# Patient Record
Sex: Male | Born: 1960
Health system: Southern US, Community
[De-identification: ages and names within clinical notes are randomized; demographics above are authoritative.]

## PROBLEM LIST (undated history)

## (undated) DIAGNOSIS — I2510 Atherosclerotic heart disease of native coronary artery without angina pectoris: Secondary | ICD-10-CM

## (undated) HISTORY — PX: CARDIAC SURGERY: SHX584

## (undated) HISTORY — PX: APPENDECTOMY: SHX54

---

## 2020-02-13 ENCOUNTER — Inpatient Hospital Stay (HOSPITAL_BASED_OUTPATIENT_CLINIC_OR_DEPARTMENT_OTHER)
Admission: EM | Admit: 2020-02-13 | Discharge: 2020-02-15 | DRG: 300 | Disposition: A | Discharge status: Home / Self Care | Payer: 59 | Attending: Internal Medicine | Admitting: Internal Medicine

## 2020-02-13 ENCOUNTER — Other Ambulatory Visit: Payer: Self-pay

## 2020-02-13 ENCOUNTER — Encounter (HOSPITAL_BASED_OUTPATIENT_CLINIC_OR_DEPARTMENT_OTHER): Payer: Self-pay | Admitting: Emergency Medicine

## 2020-02-13 ENCOUNTER — Emergency Department (HOSPITAL_BASED_OUTPATIENT_CLINIC_OR_DEPARTMENT_OTHER): Payer: 59

## 2020-02-13 ENCOUNTER — Other Ambulatory Visit: Payer: Self-pay | Admitting: Oncology

## 2020-02-13 DIAGNOSIS — Z951 Presence of aortocoronary bypass graft: Secondary | ICD-10-CM | POA: Diagnosis not present

## 2020-02-13 DIAGNOSIS — D735 Infarction of spleen: Secondary | ICD-10-CM | POA: Diagnosis present

## 2020-02-13 DIAGNOSIS — I82890 Acute embolism and thrombosis of other specified veins: Secondary | ICD-10-CM | POA: Diagnosis present

## 2020-02-13 DIAGNOSIS — E785 Hyperlipidemia, unspecified: Secondary | ICD-10-CM | POA: Diagnosis present

## 2020-02-13 DIAGNOSIS — E669 Obesity, unspecified: Secondary | ICD-10-CM | POA: Diagnosis present

## 2020-02-13 DIAGNOSIS — I252 Old myocardial infarction: Secondary | ICD-10-CM | POA: Diagnosis not present

## 2020-02-13 DIAGNOSIS — N179 Acute kidney failure, unspecified: Secondary | ICD-10-CM | POA: Diagnosis present

## 2020-02-13 DIAGNOSIS — I5031 Acute diastolic (congestive) heart failure: Secondary | ICD-10-CM | POA: Diagnosis not present

## 2020-02-13 DIAGNOSIS — Z20822 Contact with and (suspected) exposure to covid-19: Secondary | ICD-10-CM | POA: Diagnosis present

## 2020-02-13 DIAGNOSIS — I10 Essential (primary) hypertension: Secondary | ICD-10-CM | POA: Diagnosis present

## 2020-02-13 DIAGNOSIS — I2510 Atherosclerotic heart disease of native coronary artery without angina pectoris: Secondary | ICD-10-CM | POA: Diagnosis present

## 2020-02-13 DIAGNOSIS — Z882 Allergy status to sulfonamides status: Secondary | ICD-10-CM | POA: Diagnosis not present

## 2020-02-13 DIAGNOSIS — R109 Unspecified abdominal pain: Secondary | ICD-10-CM | POA: Diagnosis present

## 2020-02-13 DIAGNOSIS — R1013 Epigastric pain: Secondary | ICD-10-CM

## 2020-02-13 DIAGNOSIS — R9431 Abnormal electrocardiogram [ECG] [EKG]: Secondary | ICD-10-CM | POA: Diagnosis not present

## 2020-02-13 DIAGNOSIS — Z8249 Family history of ischemic heart disease and other diseases of the circulatory system: Secondary | ICD-10-CM

## 2020-02-13 DIAGNOSIS — I748 Embolism and thrombosis of other arteries: Secondary | ICD-10-CM | POA: Diagnosis not present

## 2020-02-13 DIAGNOSIS — Z6836 Body mass index (BMI) 36.0-36.9, adult: Secondary | ICD-10-CM | POA: Diagnosis not present

## 2020-02-13 HISTORY — DX: Atherosclerotic heart disease of native coronary artery without angina pectoris: I25.10

## 2020-02-13 LAB — PROTIME-INR
INR: 1.3 — ABNORMAL HIGH (ref 0.8–1.2)
Prothrombin Time: 15.4 seconds — ABNORMAL HIGH (ref 11.4–15.2)

## 2020-02-13 LAB — CBC WITH DIFFERENTIAL/PLATELET
Abs Immature Granulocytes: 0.05 10*3/uL (ref 0.00–0.07)
Basophils Absolute: 0.1 10*3/uL (ref 0.0–0.1)
Basophils Relative: 1 %
Eosinophils Absolute: 0.1 10*3/uL (ref 0.0–0.5)
Eosinophils Relative: 1 %
HCT: 45.5 % (ref 39.0–52.0)
Hemoglobin: 15.1 g/dL (ref 13.0–17.0)
Immature Granulocytes: 0 %
Lymphocytes Relative: 12 %
Lymphs Abs: 1.8 10*3/uL (ref 0.7–4.0)
MCH: 29.3 pg (ref 26.0–34.0)
MCHC: 33.2 g/dL (ref 30.0–36.0)
MCV: 88.3 fL (ref 80.0–100.0)
Monocytes Absolute: 1 10*3/uL (ref 0.1–1.0)
Monocytes Relative: 6 %
Neutro Abs: 12.4 10*3/uL — ABNORMAL HIGH (ref 1.7–7.7)
Neutrophils Relative %: 80 %
Platelets: 191 10*3/uL (ref 150–400)
RBC: 5.15 MIL/uL (ref 4.22–5.81)
RDW: 11.7 % (ref 11.5–15.5)
WBC: 15.4 10*3/uL — ABNORMAL HIGH (ref 4.0–10.5)
nRBC: 0 % (ref 0.0–0.2)

## 2020-02-13 LAB — COMPREHENSIVE METABOLIC PANEL
ALT: 47 U/L — ABNORMAL HIGH (ref 0–44)
AST: 38 U/L (ref 15–41)
Albumin: 4 g/dL (ref 3.5–5.0)
Alkaline Phosphatase: 86 U/L (ref 38–126)
Anion gap: 9 (ref 5–15)
BUN: 12 mg/dL (ref 6–20)
CO2: 24 mmol/L (ref 22–32)
Calcium: 8.9 mg/dL (ref 8.9–10.3)
Chloride: 106 mmol/L (ref 98–111)
Creatinine, Ser: 1.34 mg/dL — ABNORMAL HIGH (ref 0.61–1.24)
GFR calc Af Amer: >60 mL/min (ref >60)
GFR calc non Af Amer: 57 mL/min — ABNORMAL LOW (ref >60)
Glucose, Bld: 124 mg/dL — ABNORMAL HIGH (ref 70–99)
Potassium: 4.1 mmol/L (ref 3.5–5.1)
Sodium: 139 mmol/L (ref 135–145)
Total Bilirubin: 0.7 mg/dL (ref 0.3–1.2)
Total Protein: 7.2 g/dL (ref 6.5–8.1)

## 2020-02-13 LAB — TROPONIN I (HIGH SENSITIVITY)
Troponin I (High Sensitivity): 9 ng/L (ref <18)
Troponin I (High Sensitivity): 8 ng/L (ref <18)

## 2020-02-13 LAB — HEMOGLOBIN A1C
Hgb A1c MFr Bld: 5.4 % (ref 4.8–5.6)
Mean Plasma Glucose: 108.28 mg/dL

## 2020-02-13 LAB — HEPARIN LEVEL (UNFRACTIONATED)
Heparin Unfractionated: 1.08 IU/mL — ABNORMAL HIGH (ref 0.30–0.70)
Heparin Unfractionated: 1.01 IU/mL — ABNORMAL HIGH (ref 0.30–0.70)

## 2020-02-13 LAB — HIV ANTIBODY (ROUTINE TESTING W REFLEX): HIV Screen 4th Generation wRfx: NONREACTIVE

## 2020-02-13 LAB — LIPASE, BLOOD: Lipase: 72 U/L — ABNORMAL HIGH (ref 11–51)

## 2020-02-13 LAB — SARS CORONAVIRUS 2 BY RT PCR (HOSPITAL ORDER, PERFORMED IN ~~LOC~~ HOSPITAL LAB): SARS Coronavirus 2: NEGATIVE

## 2020-02-13 LAB — APTT: aPTT: >200 seconds (ref 24–36)

## 2020-02-13 MED ORDER — ATORVASTATIN CALCIUM 40 MG PO TABS
40.0000 mg | ORAL_TABLET | Freq: Every day | ORAL | Status: DC
  Administered 2020-02-13 – 2020-02-15 (×3): 40 mg via ORAL
  Filled 2020-02-13 (×3): qty 1

## 2020-02-13 MED ORDER — NITROGLYCERIN 0.4 MG SL SUBL
0.4000 mg | SUBLINGUAL_TABLET | SUBLINGUAL | Status: AC | PRN
  Administered 2020-02-13 (×3): 0.4 mg via SUBLINGUAL
  Filled 2020-02-13 (×3): qty 1

## 2020-02-13 MED ORDER — ACETAMINOPHEN 325 MG PO TABS: 650.0000 mg | ORAL_TABLET | Freq: Four times a day (QID) | ORAL | Status: DC | PRN

## 2020-02-13 MED ORDER — ASPIRIN 81 MG PO CHEW
81.0000 mg | CHEWABLE_TABLET | Freq: Every day | ORAL | Status: DC
  Administered 2020-02-14 – 2020-02-15 (×2): 81 mg via ORAL
  Filled 2020-02-13 (×2): qty 1

## 2020-02-13 MED ORDER — HEPARIN BOLUS VIA INFUSION
5000.0000 [IU] | Freq: Once | INTRAVENOUS | Status: AC
  Administered 2020-02-13: 5000 [IU] via INTRAVENOUS

## 2020-02-13 MED ORDER — LISINOPRIL 5 MG PO TABS
5.0000 mg | ORAL_TABLET | Freq: Every day | ORAL | Status: DC
  Administered 2020-02-13: 5 mg via ORAL
  Filled 2020-02-13: qty 1

## 2020-02-13 MED ORDER — METOPROLOL SUCCINATE ER 100 MG PO TB24
100.0000 mg | ORAL_TABLET | Freq: Every day | ORAL | Status: DC
  Administered 2020-02-14 – 2020-02-15 (×2): 100 mg via ORAL
  Filled 2020-02-13 (×2): qty 1

## 2020-02-13 MED ORDER — MORPHINE SULFATE (PF) 2 MG/ML IV SOLN
2.0000 mg | Freq: Once | INTRAVENOUS | Status: AC
  Administered 2020-02-13: 2 mg via INTRAVENOUS
  Filled 2020-02-13: qty 1

## 2020-02-13 MED ORDER — LACTATED RINGERS IV SOLN: INTRAVENOUS | Status: DC

## 2020-02-13 MED ORDER — MORPHINE SULFATE (PF) 4 MG/ML IV SOLN
4.0000 mg | INTRAVENOUS | Status: DC | PRN
  Administered 2020-02-13 – 2020-02-14 (×4): 4 mg via INTRAVENOUS
  Filled 2020-02-13 (×4): qty 1

## 2020-02-13 MED ORDER — HEPARIN (PORCINE) 25000 UT/250ML-% IV SOLN
1500.0000 [IU]/h | INTRAVENOUS | Status: DC
  Administered 2020-02-13: 1500 [IU]/h via INTRAVENOUS
  Filled 2020-02-13 (×2): qty 250

## 2020-02-13 MED ORDER — ONDANSETRON HCL 4 MG/2ML IJ SOLN
4.0000 mg | Freq: Once | INTRAMUSCULAR | Status: AC
  Administered 2020-02-13: 4 mg via INTRAVENOUS
  Filled 2020-02-13: qty 2

## 2020-02-13 MED ORDER — ONDANSETRON HCL 4 MG/2ML IJ SOLN
4.0000 mg | Freq: Four times a day (QID) | INTRAMUSCULAR | Status: DC | PRN
  Administered 2020-02-13: 4 mg via INTRAVENOUS
  Filled 2020-02-13: qty 2

## 2020-02-13 MED ORDER — IOHEXOL 300 MG/ML  SOLN
100.0000 mL | Freq: Once | INTRAMUSCULAR | Status: AC | PRN
  Administered 2020-02-13: 100 mL via INTRAVENOUS

## 2020-02-13 MED ORDER — ACETAMINOPHEN 650 MG RE SUPP: 650.0000 mg | Freq: Four times a day (QID) | RECTAL | Status: DC | PRN

## 2020-02-13 MED ORDER — HYDROCODONE-ACETAMINOPHEN 5-325 MG PO TABS
1.0000 | ORAL_TABLET | Freq: Four times a day (QID) | ORAL | Status: DC | PRN
  Administered 2020-02-14: 1 via ORAL
  Filled 2020-02-13: qty 1

## 2020-02-13 MED ORDER — HEPARIN (PORCINE) 25000 UT/250ML-% IV SOLN
1300.0000 [IU]/h | INTRAVENOUS | Status: DC
  Administered 2020-02-13 – 2020-02-14 (×3): 1300 [IU]/h via INTRAVENOUS
  Filled 2020-02-13 (×3): qty 250

## 2020-02-13 MED ORDER — NITROGLYCERIN 0.4 MG SL SUBL: 0.4000 mg | SUBLINGUAL_TABLET | SUBLINGUAL | Status: DC | PRN

## 2020-02-13 MED ORDER — HYDRALAZINE HCL 20 MG/ML IJ SOLN: 10.0000 mg | Freq: Four times a day (QID) | INTRAMUSCULAR | Status: DC | PRN

## 2020-02-13 MED ORDER — POLYETHYLENE GLYCOL 3350 17 G PO PACK: 17.0000 g | PACK | Freq: Every day | ORAL | Status: DC | PRN

## 2020-02-13 MED ORDER — ASPIRIN 81 MG PO CHEW
324.0000 mg | CHEWABLE_TABLET | Freq: Once | ORAL | Status: AC
  Administered 2020-02-13: 324 mg via ORAL
  Filled 2020-02-13: qty 4

## 2020-02-13 NOTE — ED Provider Notes (Addendum)
TIME SEEN: 5:14 AM  CHIEF COMPLAINT: Upper abdominal pain  HPI: Patient is a 59 year old male with history of CAD status post two-vessel CABG in 2017 at [facility] who presents to the emergency department with complaints of upper abdominal soreness that started at 11 PM last night.  Reports around 1 AM he took 2 Tylenol hoping it would improve.  Came in for further evaluation.  States this does not feel exactly like his anginal equivalent because at that time he had chest pressure and he is having no chest pain currently.  Denies shortness of breath, nausea, vomiting, diarrhea, fever, bloody stools, melena.  He states he was most concerned that this could be his heart.  His cardiologist is Dr. Judithe Modest in [facility].  States he still has his gallbladder.  States for dinner last night he ate chicken pie, corn, beans.  No history of PE, DVT, exogenous estrogen use, recent fractures, surgery, trauma, hospitalization or prolonged travel. No lower extremity swelling or pain. No calf tenderness.  ROS: See HPI Constitutional: no fever  Eyes: no drainage  ENT: no runny nose   Cardiovascular:  no chest pain  Resp: no SOB  GI: no vomiting GU: no dysuria Integumentary: no rash  Allergy: no hives  Musculoskeletal: no leg swelling  Neurological: no slurred speech ROS otherwise negative  PAST MEDICAL HISTORY/PAST SURGICAL HISTORY:  Past Medical History:  Diagnosis Date  . Coronary artery disease     MEDICATIONS:  Prior to Admission medications   Medication Sig Start Date End Date Taking? Authorizing Provider  aspirin 81 MG chewable tablet Chew by mouth. 07/12/16  Yes [provider]  atorvastatin (LIPITOR) 40 MG tablet TAKE 1 TABLET BY MOUTH EVERY DAY FOR CHOLESTEROL 10/17/16  Yes [provider]  lisinopril (ZESTRIL) 5 MG tablet TAKE 1 TABLET BY MOUTH EVERY DAY FOR BLOOD PRESSURE 10/17/16  Yes [provider]  metoprolol succinate (TOPROL-XL) 100 MG 24  hr tablet Take 1 tablet by mouth daily. 10/17/16  Yes [provider]  nitroGLYCERIN (NITROSTAT) 0.4 MG SL tablet Place under the tongue. 10/07/19  Yes [provider]    ALLERGIES:  Allergies  Allergen Reactions  . Sulfa Antibiotics Rash    SOCIAL HISTORY:  Social History   Tobacco Use  . Smoking status: Never Smoker  . Smokeless tobacco: Never Used  Substance Use Topics  . Alcohol use: Never    FAMILY HISTORY: No family history on file.  EXAM: BP (!) 170/90 (BP Location: Right Arm)   Pulse 60   Temp 98 F (36.7 C) (Oral)   Resp 20   SpO2 96%  CONSTITUTIONAL: Alert and oriented and responds appropriately to questions.  Obese, no significant distress HEAD: Normocephalic EYES: Conjunctivae clear, pupils appear equal, EOM appear intact ENT: normal nose; moist mucous membranes NECK: Supple, normal ROM CARD: RRR; S1 and S2 appreciated; no murmurs, no clicks, no rubs, no gallops RESP: Normal chest excursion without splinting or tachypnea; breath sounds clear and equal bilaterally; no wheezes, no rhonchi, no rales, no hypoxia or respiratory distress, speaking full sentences ABD/GI: Normal bowel sounds; non-distended; soft, minimally tender to deep palpation in the right upper quadrant but negative Murphy sign, no rebound, no guarding, no peritoneal signs, no hepatosplenomegaly BACK:  The back appears normal EXT: Normal ROM in all joints; no deformity noted, no edema; no cyanosis, no calf tenderness or calf swelling SKIN: Normal color for age and race; warm; no rash on exposed skin NEURO: Moves  all extremities equally PSYCH: The patient's mood and manner are appropriate.   MEDICAL DECISION MAKING: Patient here with upper abdominal pain.  He states this feels different than his anginal equivalent but he was not concerned that this could be cardiac in nature.  His EKG here shows minimal ST elevation in inferior leads and PR depression and there is also ST depression  in lateral leads.  There is no old here for comparison.  We were able to get a copy of his old EKG from October 2017 from [facility] that shows that these abnormalities appear to be new.  Will discuss with his cardiologist at [facility].  He does not want to go to [facility] as he states he knows the [employer].  Will obtain cardiac labs, give aspirin and nitroglycerin.  Will also obtain abdominal labs and CT of abdomen pelvis as this also could be cholelithiasis, cholangitis, cholecystitis, pancreatitis, gastritis, GERD.  ED PROGRESS: Patient's pain has come down from an 8/10 down to a 4/3:10 nitroglycerin tablets.  We will give morphine, Zofran.   6:00 AM  Spoke with Dr. Judithe Modest with cardiology at [facility].  Discussed these EKG changes with his cardiologist.  He agrees that patient should be monitored overnight but at this time there are no beds at [facility].  Patient's EKG does not meet STEMI criteria and his initial troponin after having pain for several hours is 9.  Cardiology recommends discussion with hospitalist for admission.   7:20 AM  Pt's CT of the abdomen pelvis reviewed/interpreted and shows a large splenic infarct associated with a probable splenic artery branch infarct.  Will start heparin.  Updated patient and wife at bedside.  Suspect this is the cause of his upper abdominal pain.  We did discuss that he needs admission to the hospital for IV heparin and possible vascular surgery, general surgery consultations.  We also have recommended per his cardiologist recommendations that he have serial troponins given his EKG changes.  Unfortunately at this time there are no beds available at [facility] or [facility] hospital and they were unable to accept patient currently.  Discussed this with patient and wife.  Patient is upset as he states he would prefer to go to [facility].  He would like time to talk to his wife before  deciding what to do next.    7:35 AM  Spoke with patient and wife again.  He agrees at this time to go to [facility] for admission.  IV heparin has been ordered.  Reports his pain is coming back.  Will give another dose of morphine here.  I reviewed all nursing notes and pertinent previous records as available.  I have reviewed and interpreted any EKGs, lab and urine results, imaging (as available).   EKG Interpretation  Date/Time:  Friday February 13 2020 05:08:53 EDT Ventricular Rate:  58 PR Interval:    QRS Duration: 89 QT Interval:  399 QTC Calculation: 392 R Axis:   71 Text Interpretation: Sinus rhythm Probable left atrial enlargement Abnormal R-wave progression, late transition minimal ST elevation and PR depression in inferior leads ST depression in lateral leads No old tracing to compare Confirmed by Naveya Ellerman, Baxter Hire [phone]) on 02/13/2020 6:40:34 AM        CRITICAL CARE Performed by: Baxter Hire Franziska Podgurski   Total critical care time: 65 minutes  Critical care time was exclusive of separately billable procedures and treating other patients.  Critical  care was necessary to treat or prevent imminent or life-threatening deterioration.  Critical care was time spent personally by me on the following activities: development of treatment plan with patient and/or surrogate as well as nursing, discussions with consultants, evaluation of patient's response to treatment, examination of patient, obtaining history from patient or surrogate, ordering and performing treatments and interventions, ordering and review of laboratory studies, ordering and review of radiographic studies, pulse oximetry and re-evaluation of patient's condition.   Javier White was evaluated in Emergency Department on 02/13/2020 for the symptoms described in the history of present illness. He was evaluated in the context of the global COVID-19 pandemic, which necessitated consideration that the patient might be at risk  for infection with the SARS-CoV-2 virus that causes COVID-19. Institutional protocols and algorithms that pertain to the evaluation of patients at risk for COVID-19 are in a state of rapid change based on information released by regulatory bodies including the CDC and federal and state organizations. These policies and algorithms were followed during the patient's care in the ED.         Dae Antonucci, Delice Bison, DO 02/13/20 [phone]

## 2020-02-13 NOTE — ED Notes (Signed)
PT at CT

## 2020-02-13 NOTE — Progress Notes (Addendum)
Received a call from Dr. Thedore Mins [facility] requesting outpatient appointment with Dr. Bertis Ruddy for hypercoagulability work-up.  New patient hematology referral placed.

## 2020-02-13 NOTE — Progress Notes (Addendum)
ANTICOAGULATION CONSULT NOTE - Follow Up Consult  Pharmacy Consult for Heparin Indication: Splenic Artery Infarct  Allergies  Allergen Reactions  . Sulfa Antibiotics Rash    Patient Measurements: Height: 5\' 9"  (175.3 cm) Weight: 113.4 kg (250 lb) IBW/kg (Calculated) : 70.7 Heparin Dosing Weight: 96 kg  Vital Signs: Temp: 98.5 F (36.9 C) (06/11 1522) Temp Source: Oral (06/11 1522) BP: 147/76 (06/11 1522) Pulse Rate: 68 (06/11 1522)  Labs: Recent Labs    02/13/20 0528 02/13/20 0725 02/13/20 0816 02/13/20 1405 02/13/20 1635  HGB 15.1  --   --   --   --   HCT 45.5  --   --   --   --   PLT 191  --   --   --   --   APTT  --   --  >200*  --   --   LABPROT  --   --  15.4*  --   --   INR  --   --  1.3*  --   --   HEPARINUNFRC  --   --   --  1.08* 1.01*  CREATININE 1.34*  --   --   --   --   TROPONINIHS 9 8  --   --   --     Estimated Creatinine Clearance: 72.8 mL/min (A) (by C-G formula based on SCr of 1.34 mg/dL (H)).   Medical History: Past Medical History:  Diagnosis Date  . Coronary artery disease     Assessment: 59 yr old male presented with epigastric pain, hx of CAD (S/P CABG).  CT showing large splenic artery infarct, likely branch occlusion.  Pt was not on anticoagulation PTA.  CBC WNL, INR 1.3, aPTT was >200 sec (but drawn right after heparin bolus given).   Initial heparin level drawn ~6 hrs after heparin 5000 units IV bolus X 1, followed by heparin infusion at 1500 units/hr, was 1.08 units/ml, which is above the goal range for this pt. Heparin level was drawn at [facility] prior to transfer to [facility], so RN does not know if heparin level was drawn from same side as where heparin is infusing. Per RN, no issues with IV or bleeding observed.  Repeat heparin level was 1.01 units/ml (also above goal range for this pt)  Hypercoagulability work up planned with hematology-oncology as outpatient.  Goal of Therapy:  Heparin level 0.3-0.7  units/ml Monitor platelets by anticoagulation protocol: Yes   Plan:  Hold heparin infusion X 1 hr, then restart heparin infusion at 1300 units/hr Check 6-hr heparin level after restarting heparin infusion Monitor daily heparin level, CBC Monitor for signs/symptoms of bleeding F/U transition to oral anticoagulant  [facility], PharmD, BCPS, [facility] Clinical Pharmacist 02/13/2020 4:19 PM

## 2020-02-13 NOTE — ED Notes (Signed)
Pt wanted to use the restroom and did not want to use the urinal and RN Maggie informed.

## 2020-02-13 NOTE — Progress Notes (Signed)
ANTICOAGULATION CONSULT NOTE - Initial Consult  Pharmacy Consult for heparin Indication: Splenic artery infarct  Allergies  Allergen Reactions  . Sulfa Antibiotics Rash    Patient Measurements: Height: 5\' 9"  (175.3 cm) Weight: 113.4 kg (250 lb) IBW/kg (Calculated) : 70.7 Heparin Dosing Weight: 96kg  Vital Signs: Temp: 98 F (36.7 C) (06/11 0446) Temp Source: Oral (06/11 0446) BP: 129/69 (06/11 0723) Pulse Rate: 54 (06/11 0723)  Labs: Recent Labs    02/13/20 0528  HGB 15.1  HCT 45.5  PLT 191  CREATININE 1.34*  TROPONINIHS 9    Estimated Creatinine Clearance: 72.8 mL/min (A) (by C-G formula based on SCr of 1.34 mg/dL (H)).   Medical History: Past Medical History:  Diagnosis Date  . Coronary artery disease     Assessment: 70 YOM presenting with epigastric pain, hx of CAD s/p CABG.  CT showing large splenic artery infarct, likely branch occlusion.  Not on anticoagulation PTA.  CBC wnl.  Goal of Therapy:  Heparin level 0.3-0.7 units/ml Monitor platelets by anticoagulation protocol: Yes   Plan:  Heparin 5000 units IV x 1, and gtt at 1500 units/hr F/u 6 hour heparin level  66, PharmD Clinical Pharmacist ED Pharmacist Phone # [phone] 02/13/2020 7:24 AM

## 2020-02-13 NOTE — ED Provider Notes (Signed)
Discussed with Dr. Jomarie Longs who accepts in transfer to admission at [facility], Lorin Picket, MD 02/13/20 [phone]

## 2020-02-13 NOTE — H&P (Signed)
TRH H&P   Patient Demographics:    Javier White, is a 59 y.o. male  MRN: [id number]   DOB - 04/21/1961  Admit Date - 02/13/2020  Outpatient Primary MD for the patient is Chauncy Lean, PA-C    Patient coming from: Home >> [facility]  Chief Complaint  Patient presents with  . Abdominal Pain      HPI:    Javier White  is a 59 y.o. male, history of CAD s/p MI and bypass surgery in 2017, hypertension, dyslipidemia, obesity with a BMI of 36, who was in good state of health until he woke up around 630 this morning with epigastric abdominal pain, presented to med [facility] ER where work-up suggested splenic artery occlusion with splenic infarct, he was started on heparin drip and transferred to [facility] for further care.  Patient currently is rate to be symptom-free denies any headache, no fever chills, no chest pain palpitations cough or shortness of breath, minimal epigastric abdominal pain with no radiation, no aggravating relieving factors, no other associated symptoms, no diarrhea or dysuria, no blood in stool or urine, no focal weakness.  Patient denies any history of irregular heartbeats or atrial fibrillation, no previous history of strokes, no history of DVT PE, no recent travels.  No history of malignancy.    Review of systems:     A full 10 point Review of Systems was done, except as stated above, all other Review of Systems were negative.   With Past History of the following :    Past Medical History:  Diagnosis Date  . Coronary artery disease       Past Surgical History:  Procedure Laterality Date  . APPENDECTOMY    . CARDIAC SURGERY        Social History:     Social History    Tobacco Use  . Smoking status: Never Smoker  . Smokeless tobacco: Never Used  Substance Use Topics  . Alcohol use: Never       Family History :   CAD in his 63s and his father.   Home Medications:   Prior to Admission medications   Medication Sig Start Date End Date Taking? Authorizing Provider  aspirin 81 MG chewable tablet Chew by mouth. 07/12/16  Yes [provider]  atorvastatin (LIPITOR) 40 MG tablet TAKE 1  TABLET BY MOUTH EVERY DAY FOR CHOLESTEROL 10/17/16  Yes [provider]  lisinopril (ZESTRIL) 5 MG tablet TAKE 1 TABLET BY MOUTH EVERY DAY FOR BLOOD PRESSURE 10/17/16  Yes [provider]  metoprolol succinate (TOPROL-XL) 100 MG 24 hr tablet Take 1 tablet by mouth daily. 10/17/16  Yes [provider]  nitroGLYCERIN (NITROSTAT) 0.4 MG SL tablet Place under the tongue. 10/07/19  Yes [provider]     Allergies:     Allergies  Allergen Reactions  . Sulfa Antibiotics Rash     Physical Exam:   Vitals  Blood pressure (!) 147/76, pulse 68, temperature 98.5 F (36.9 C), temperature source Oral, resp. rate 18, height 5\' 9"  (1.753 m), weight 113.4 kg, SpO2 100 %.   1. General middle-aged obese Caucasian male lying in hospital bed in no apparent distress  2. Normal affect and insight, Not Suicidal or Homicidal, Awake Alert,   3. No F.N deficits, ALL C.Nerves Intact, Strength 5/5 all 4 extremities, Sensation intact all 4 extremities, Plantars down going.  4. Ears and Eyes appear Normal, Conjunctivae clear, PERRLA. Moist Oral Mucosa.  5. Supple Neck, No JVD, No cervical lymphadenopathy appriciated, No Carotid Bruits.  6. Symmetrical Chest wall movement, Good air movement bilaterally, CTAB.  7. RRR, No Gallops, Rubs or Murmurs, No Parasternal Heave.  8. Positive Bowel Sounds, Abdomen Soft, mild epigastric tenderness, No organomegaly appriciated, No rebound -guarding or rigidity.  9.  No Cyanosis, Normal Skin Turgor, No Skin  Rash or Bruise.  10. Good muscle tone,  joints appear normal , no effusions, Normal ROM.  11. No Palpable Lymph Nodes in Neck or Axillae     Data Review:    CBC Recent Labs  Lab 02/13/20 0528  WBC 15.4*  HGB 15.1  HCT 45.5  PLT 191  MCV 88.3  MCH 29.3  MCHC 33.2  RDW 11.7  LYMPHSABS 1.8  MONOABS 1.0  EOSABS 0.1  BASOSABS 0.1   ------------------------------------------------------------------------------------------------------------------  Chemistries  Recent Labs  Lab 02/13/20 0528  NA 139  K 4.1  CL 106  CO2 24  GLUCOSE 124*  BUN 12  CREATININE 1.34*  CALCIUM 8.9  AST 38  ALT 47*  ALKPHOS 86  BILITOT 0.7   ------------------------------------------------------------------------------------------------------------------ estimated creatinine clearance is 72.8 mL/min (A) (by C-G formula based on SCr of 1.34 mg/dL (H)). ------------------------------------------------------------------------------------------------------------------ No results for input(s): TSH, T4TOTAL, T3FREE, THYROIDAB in the last 72 hours.  Invalid input(s): FREET3  Coagulation profile Recent Labs  Lab 02/13/20 0816  INR 1.3*   ------------------------------------------------------------------------------------------------------------------- No results for input(s): DDIMER in the last 72 hours. -------------------------------------------------------------------------------------------------------------------  Cardiac Enzymes No results for input(s): CKMB, TROPONINI, MYOGLOBIN in the last 168 hours.  Invalid input(s): CK ------------------------------------------------------------------------------------------------------------------ No results found for: BNP   ---------------------------------------------------------------------------------------------------------------  Urinalysis No results found for: COLORURINE, APPEARANCEUR, LABSPEC, PHURINE, GLUCOSEU, HGBUR,  BILIRUBINUR, KETONESUR, PROTEINUR, UROBILINOGEN, NITRITE, LEUKOCYTESUR  ----------------------------------------------------------------------------------------------------------------   Imaging Results:    CT ABDOMEN PELVIS W CONTRAST  Result Date: 02/13/2020 CLINICAL DATA:  Upper abdominal pain EXAM: CT ABDOMEN AND PELVIS WITH CONTRAST TECHNIQUE: Multidetector CT imaging of the abdomen and pelvis was performed using the standard protocol following bolus administration of intravenous contrast. CONTRAST:  175mL OMNIPAQUE IOHEXOL 300 MG/ML  SOLN COMPARISON:  None. FINDINGS: Lower chest: Fatty band of cross the apical left ventricle, subendocardial. There is history of cardiac surgery and a prior median sternotomy, presumably related. Hepatobiliary: No focal liver abnormality.No evidence of biliary obstruction or stone. Pancreas: Unremarkable. Spleen: Wedge-shaped area of  non enhancement involving the central spleen, at least 50% of the volume. No hemorrhage is noted. There is an associated poorly enhancing splenic artery branch at the hilum. No underlying dissection noted. Adrenals/Urinary Tract: Negative adrenals. No hydronephrosis or stone. 13 mm left renal cyst. The dome of the bladder is deviated into a right inguinal hernia. Stomach/Bowel:  No obstruction. No appendicitis. Vascular/Lymphatic: No acute vascular abnormality. Scattered atheromatous changes. No mass or adenopathy. Reproductive:No pathologic findings. Other: No ascites or pneumoperitoneum. Fatty enlargement of the right inguinal canal. There has been a left inguinal hernia repair using mesh. Musculoskeletal: No acute abnormalities. IMPRESSION: 1. 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 2. Large fatty right inguinal hernia with minimal involvement of the bladder dome. 3.  Aortic Atherosclerosis (ICD10-I70.0). Electronically Signed   By: Marnee Spring M.D.   On: 02/13/2020 06:50   DG Chest Portable  1 View  Result Date: 02/13/2020 CLINICAL DATA:  Upper abdominal pain EXAM: PORTABLE CHEST 1 VIEW COMPARISON:  Radiograph 06/17/2016 FINDINGS: Postsurgical changes related to prior CABG including intact and aligned sternotomy wires and multiple surgical clips projecting over the mediastinum. Could mediastinal contours are otherwise within normal limits for the portable technique. Some streaky opacities in the bases likely reflect atelectasis. No focal consolidation, features of edema, pneumothorax, or effusion. No acute osseous or soft tissue abnormality. Degenerative changes are present in the imaged spine and shoulders. Telemetry leads overlie the chest. IMPRESSION: Streaky opacities in the bases likely reflect atelectasis. No other acute cardiopulmonary abnormality. Electronically Signed   By: Kreg Shropshire M.D.   On: 02/13/2020 05:45    My personal review of EKG: Rhythm NSR, 58 bpm with poor R wave progression   Assessment & Plan:     1.  Acute epigastric abdominal pain due to acute splenic artery occlusion with splenic infarct.  I have discussed the case with both vascular surgeon Dr. Sherald Hess and hematologist Dr. Bertis Ruddy.  No hypercoagulable work-up in the acute setting this will be done outpatient, for now patient will be kept on IV heparin for 24 hours thereafter likely switch to Eliquis, if he tolerates it well discharged home on 02/15/2020.  Will check echocardiogram, LDL and A1c for other risk factors, monitor on telemetry for any irregular heart rhythm.  2.  AKI.  Last creatinine 2 years ago from [facility] is around 1.1.  Hydrate and monitor hold ACE inhibitor.  3.  Hypertension.  Hold ACE inhibitor, continue beta-blocker, as needed IV hydralazine.  4.  CAD s/p bypass.  No acute issues.  Continue antiplatelet medication, beta-blocker and statin for secondary prevention.  5.  Obesity.  BMI 36.  Follow with PCP for weight loss.     DVT Prophylaxis Heparin gtt  AM Labs Ordered,  also please review Full Orders  Family Communication: Admission, patients condition and plan of care including tests being ordered have been discussed with the patient  who indicates understanding and agree with the plan and Code Status.  Code Status Full  Likely DC to  Home  Condition GUARDED    Consults called: DW oncologist Dr. Marcelyn Bruins and vascular surgeon Dr. Sherald Hess  Admission status: Inpatient  Time spent in minutes : 35 minutes   Susa Raring M.D on 02/13/2020 at 3:50 PM  To page go to [email or web address] - password [facility]

## 2020-02-13 NOTE — ED Triage Notes (Signed)
Pt reports epigastric pain starting at 2300, took Tylenol at 0200 with no relief, pain 8/10, hx of MI and double bypass in 2017

## 2020-02-14 ENCOUNTER — Inpatient Hospital Stay (HOSPITAL_COMMUNITY): Payer: 59

## 2020-02-14 DIAGNOSIS — I5031 Acute diastolic (congestive) heart failure: Secondary | ICD-10-CM

## 2020-02-14 DIAGNOSIS — R9431 Abnormal electrocardiogram [ECG] [EKG]: Secondary | ICD-10-CM

## 2020-02-14 DIAGNOSIS — I82890 Acute embolism and thrombosis of other specified veins: Secondary | ICD-10-CM

## 2020-02-14 LAB — CBC
HCT: 40.9 % (ref 39.0–52.0)
Hemoglobin: 13.4 g/dL (ref 13.0–17.0)
MCH: 29.4 pg (ref 26.0–34.0)
MCHC: 32.8 g/dL (ref 30.0–36.0)
MCV: 89.7 fL (ref 80.0–100.0)
Platelets: 176 10*3/uL (ref 150–400)
RBC: 4.56 MIL/uL (ref 4.22–5.81)
RDW: 12 % (ref 11.5–15.5)
WBC: 14.1 10*3/uL — ABNORMAL HIGH (ref 4.0–10.5)
nRBC: 0 % (ref 0.0–0.2)

## 2020-02-14 LAB — BASIC METABOLIC PANEL
Anion gap: 9 (ref 5–15)
BUN: 7 mg/dL (ref 6–20)
CO2: 22 mmol/L (ref 22–32)
Calcium: 8.5 mg/dL — ABNORMAL LOW (ref 8.9–10.3)
Chloride: 104 mmol/L (ref 98–111)
Creatinine, Ser: 1.04 mg/dL (ref 0.61–1.24)
GFR calc Af Amer: >60 mL/min (ref >60)
GFR calc non Af Amer: >60 mL/min (ref >60)
Glucose, Bld: 141 mg/dL — ABNORMAL HIGH (ref 70–99)
Potassium: 3.9 mmol/L (ref 3.5–5.1)
Sodium: 135 mmol/L (ref 135–145)

## 2020-02-14 LAB — HEPARIN LEVEL (UNFRACTIONATED)
Heparin Unfractionated: 0.52 IU/mL (ref 0.30–0.70)
Heparin Unfractionated: 0.66 IU/mL (ref 0.30–0.70)

## 2020-02-14 LAB — MAGNESIUM: Magnesium: 1.6 mg/dL — ABNORMAL LOW (ref 1.7–2.4)

## 2020-02-14 LAB — ECHOCARDIOGRAM COMPLETE
Height: 69 in
Weight: 4000.03 oz

## 2020-02-14 MED ORDER — APIXABAN 5 MG PO TABS: 5.0000 mg | ORAL_TABLET | Freq: Two times a day (BID) | ORAL | Status: DC

## 2020-02-14 MED ORDER — APIXABAN 5 MG PO TABS
10.0000 mg | ORAL_TABLET | Freq: Two times a day (BID) | ORAL | Status: DC
  Administered 2020-02-15: 10 mg via ORAL
  Filled 2020-02-14: qty 2

## 2020-02-14 MED ORDER — MAGNESIUM SULFATE 2 GM/50ML IV SOLN
2.0000 g | Freq: Once | INTRAVENOUS | Status: AC
  Administered 2020-02-14: 2 g via INTRAVENOUS
  Filled 2020-02-14: qty 50

## 2020-02-14 MED ORDER — PERFLUTREN LIPID MICROSPHERE
1.0000 mL | INTRAVENOUS | Status: AC | PRN
  Administered 2020-02-14: 3 mL via INTRAVENOUS
  Filled 2020-02-14: qty 10

## 2020-02-14 MED ORDER — ISOSORBIDE MONONITRATE ER 30 MG PO TB24
30.0000 mg | ORAL_TABLET | Freq: Every day | ORAL | Status: DC
  Administered 2020-02-14 – 2020-02-15 (×2): 30 mg via ORAL
  Filled 2020-02-14 (×2): qty 1

## 2020-02-14 MED ORDER — FUROSEMIDE 40 MG PO TABS
40.0000 mg | ORAL_TABLET | Freq: Once | ORAL | Status: AC
  Administered 2020-02-14: 40 mg via ORAL
  Filled 2020-02-14: qty 1

## 2020-02-14 NOTE — Progress Notes (Addendum)
  Echocardiogram 2D Echocardiogram has been performed with [facility].  Gerda Diss 02/14/2020, 11:07 AM

## 2020-02-14 NOTE — Progress Notes (Signed)
ANTICOAGULATION CONSULT NOTE - Follow Up Consult  Pharmacy Consult for heparin Indication: splenic artery infarct  Labs: Recent Labs    02/13/20 0528 02/13/20 0725 02/13/20 0816 02/13/20 1405 02/13/20 1635 02/14/20 0108  HGB 15.1  --   --   --   --  13.4  HCT 45.5  --   --   --   --  40.9  PLT 191  --   --   --   --  176  APTT  --   --  >200*  --   --   --   LABPROT  --   --  15.4*  --   --   --   INR  --   --  1.3*  --   --   --   HEPARINUNFRC  --   --   --  1.08* 1.01* 0.52  CREATININE 1.34*  --   --   --   --   --   TROPONINIHS 9 8  --   --   --   --     Assessment/Plan:  59yo male therapeutic on heparin after rate change. Will continue gtt at current rate and confirm stable with additional level.   Vernard Gambles, PharmD, BCPS  02/14/2020,2:00 AM

## 2020-02-14 NOTE — Progress Notes (Signed)
PROGRESS NOTE                                                                                                                                                                                                             Patient Demographics:    Javier White, is a 59 y.o. male, DOB - [birth year], MLY:[id number]  Admit date - 02/13/2020   Admitting Physician Zannie Cove, MD  Outpatient Primary MD for the patient is Chauncy Lean, PA-C  LOS - 1  Chief Complaint  Patient presents with  . Abdominal Pain       Brief Narrative -  Javier White  is a 59 y.o. male, history of CAD s/p MI and bypass surgery in 2017, hypertension, dyslipidemia, obesity with a BMI of 36, who was in good state of health until he woke up around 630 this morning with epigastric abdominal pain, presented to [facility] where work-up suggested splenic artery occlusion with splenic infarct, he was started on heparin drip and transferred to [facility] for further care.   Subjective:    Javier White today has, No headache, No chest pain, improving epigastric abdominal pain - No Nausea, No new weakness tingling or numbness, No Cough - SOB.    Assessment  & Plan :    1.  Acute epigastric abdominal pain due to acute splenic artery occlusion with splenic infarct.  I have discussed the case with both vascular surgeon Dr. Sherald Hess and hematologist Dr. Bertis Ruddy.  No hypercoagulable work-up in the acute setting this will be done outpatient, continue IV heparin switch to oral Eliquis if stable on 02/15/2020 and prepare for home discharge, case management to assist in procurement of Eliquis.  Stable A1c, stable on telemetry, LDL and echocardiogram pending, for now continue heparin and statin for secondary prevention.  Lab Results  Component Value Date   HGBA1C 5.4 02/13/2020   Lipid Panel  No results found for: CHOL, TRIG, HDL, CHOLHDL, VLDL, LDLCALC, LDLDIRECT, LABVLDL   2.  AKI.   Last creatinine 2 years ago from [facility] is around 1.1.  Hydrate and monitor hold ACE inhibitor.  3.  Hypertension.  Hold ACE inhibitor, continue beta-blocker and while in the hospital add Imdur in place of ACE inhibitor, as needed IV hydralazine.  4.  CAD s/p bypass.  No acute issues.  Continue antiplatelet medication, beta-blocker and statin for secondary prevention.  5.  Obesity.  BMI 36.  Follow with PCP for weight loss.  Condition - Fair  Family Communication  :  Wife Shirlean Mylar [phone] on 02/14/20  Code Status :  Full  Consults  : Over the phone consulted vascular surgeon Dr. Monica Martinez and hematologist Dr. Alvy Bimler on 02/13/2020.  Procedures  :    CT - 1. 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 2. Large fatty right inguinal hernia with minimal involvement of the bladder dome. 3.  Aortic Atherosclerosis (ICD10-I70.0).   PUD Prophylaxis : None  Disposition Plan  :    Status is: Inpatient  Remains inpatient appropriate because:IV treatments appropriate due to intensity of illness or inability to take PO   Dispo: The patient is from: Home              Anticipated d/c is to: Home              Anticipated d/c date is: 2 days              Patient currently is not medically stable to d/c.  On IV heparin drip for splenic infarct and splenic artery occlusion.  DVT Prophylaxis  :    Heparin   Lab Results  Component Value Date   PLT 176 02/14/2020    Diet :  Diet Order            DIET SOFT Room service appropriate? Yes; Fluid consistency: Thin  Diet effective now                  Inpatient Medications Scheduled Meds: . aspirin  81 mg Oral Daily  . atorvastatin  40 mg Oral Daily  . metoprolol succinate  100 mg Oral Daily   Continuous Infusions: . heparin 1,300 Units/hr (02/13/20 2248)   PRN Meds:.acetaminophen **OR** acetaminophen, hydrALAZINE, HYDROcodone-acetaminophen, morphine injection, nitroGLYCERIN,  ondansetron, polyethylene glycol  Antibiotics  :   Anti-infectives (From admission, onward)   None          Objective:   Vitals:   02/13/20 2050 02/14/20 0157 02/14/20 0426 02/14/20 0948  BP: 122/63 120/62 (!) 143/82 132/79  Pulse: 87 83 86 66  Resp: 18 18 18 18   Temp: 98.6 F (37 C) 98.4 F (36.9 C) 98.2 F (36.8 C) 99 F (37.2 C)  TempSrc: Oral Oral Oral Oral  SpO2: 98% 97% 97% 95%  Weight:      Height:        SpO2: 95 %  Wt Readings from Last 3 Encounters:  02/13/20 113.4 kg     Intake/Output Summary (Last 24 hours) at 02/14/2020 0952 Last data filed at 02/14/2020 0900 Gross per 24 hour  Intake 540 ml  Output 375 ml  Net 165 ml     Physical Exam  Awake Alert, No new F.N deficits, Normal affect [place].AT,PERRAL Supple Neck,No JVD, No cervical lymphadenopathy appriciated.  Symmetrical Chest wall movement, Good air movement bilaterally, CTAB RRR,No Gallops,Rubs or new Murmurs, No Parasternal Heave +ve B.Sounds, Abd Soft, mild epigastric tenderness, No organomegaly appriciated, No rebound - guarding or rigidity. No Cyanosis, Clubbing or edema, No new Rash or bruise      Data Review:    Recent Labs  Lab 02/13/20 0528 02/14/20 0108  WBC 15.4* 14.1*  HGB 15.1 13.4  HCT 45.5 40.9  PLT 191 176  MCV 88.3 89.7  MCH 29.3 29.4  MCHC 33.2 32.8  RDW 11.7 12.0  LYMPHSABS 1.8  --   MONOABS 1.0  --   EOSABS 0.1  --   BASOSABS 0.1  --  Recent Labs  Lab 02/13/20 0528 02/13/20 0816 02/13/20 1635  NA 139  --   --   K 4.1  --   --   CL 106  --   --   CO2 24  --   --   GLUCOSE 124*  --   --   BUN 12  --   --   CREATININE 1.34*  --   --   CALCIUM 8.9  --   --   AST 38  --   --   ALT 47*  --   --   ALKPHOS 86  --   --   BILITOT 0.7  --   --   ALBUMIN 4.0  --   --   INR  --  1.3*  --   HGBA1C  --   --  5.4    Recent Labs  Lab 02/13/20 0744  SARSCOV2NAA NEGATIVE      ------------------------------------------------------------------------------------------------------------------ No results for input(s): CHOL, HDL, LDLCALC, TRIG, CHOLHDL, LDLDIRECT in the last 72 hours.  Lab Results  Component Value Date   HGBA1C 5.4 02/13/2020   ------------------------------------------------------------------------------------------------------------------ No results for input(s): TSH, T4TOTAL, T3FREE, THYROIDAB in the last 72 hours.  Invalid input(s): FREET3 ------------------------------------------------------------------------------------------------------------------ No results for input(s): VITAMINB12, FOLATE, FERRITIN, TIBC, IRON, RETICCTPCT in the last 72 hours.  Coagulation profile Recent Labs  Lab 02/13/20 0816  INR 1.3*    No results for input(s): DDIMER in the last 72 hours.  Cardiac Enzymes No results for input(s): CKMB, TROPONINI, MYOGLOBIN in the last 168 hours.  Invalid input(s): CK ------------------------------------------------------------------------------------------------------------------ No results found for: BNP  Micro Results Recent Results (from the past 240 hour(s))  SARS Coronavirus 2 by RT PCR (hospital order, performed in [facility] hospital lab) Nasopharyngeal Nasopharyngeal Swab     Status: None   Collection Time: 02/13/20  7:44 AM   Specimen: Nasopharyngeal Swab  Result Value Ref Range Status   SARS Coronavirus 2 NEGATIVE NEGATIVE Final    Comment: (NOTE) SARS-CoV-2 target nucleic acids are NOT DETECTED.  The SARS-CoV-2 RNA is generally detectable in upper and lower respiratory specimens during the acute phase of infection. The lowest concentration of SARS-CoV-2 viral copies this assay can detect is 250 copies / mL. A negative result does not preclude SARS-CoV-2 infection and should not be used as the sole basis for treatment or other patient management decisions.  A negative result may occur with improper  specimen collection / handling, submission of specimen other than nasopharyngeal swab, presence of viral mutation(s) within the areas targeted by this assay, and inadequate number of viral copies (<250 copies / mL). A negative result must be combined with clinical observations, patient history, and epidemiological information.  Fact Sheet for Patients:   BoilerBrush.com.cy  Fact Sheet for Healthcare Providers: https://pope.com/  This test is not yet approved or  cleared by the [place] FDA and has been authorized for detection and/or diagnosis of SARS-CoV-2 by FDA under an Emergency Use Authorization (EUA).  This EUA will remain in effect (meaning this test can be used) for the duration of the COVID-19 declaration under Section 564(b)(1) of the Act, 21 U.S.C. section 360bbb-3(b)(1), unless the authorization is terminated or revoked sooner.  Performed at [facility], [address] [place], [place] [address]     Radiology Reports CT ABDOMEN PELVIS W CONTRAST  Result Date: 02/13/2020 CLINICAL DATA:  Upper abdominal pain EXAM: CT ABDOMEN AND PELVIS WITH CONTRAST TECHNIQUE: Multidetector CT imaging of the abdomen and  pelvis was performed using the standard protocol following bolus administration of intravenous contrast. CONTRAST:  OMNIPAQUE IOHEXOL 300 MG/ML  SOLN COMPARISON:  None. FINDINGS: Lower chest: Fatty band of cross the apical left ventricle, subendocardial. There is history of cardiac surgery and a prior median sternotomy, presumably related. Hepatobiliary: No focal liver abnormality.No evidence of biliary obstruction or stone. Pancreas: Unremarkable. Spleen: Wedge-shaped area of non enhancement involving the central spleen, at least 50% of the volume. No hemorrhage is noted. There is an associated poorly enhancing splenic artery branch at the hilum. No underlying dissection noted. Adrenals/Urinary Tract:  Negative adrenals. No hydronephrosis or stone. 13 mm left renal cyst. The dome of the bladder is deviated into a right inguinal hernia. Stomach/Bowel:  No obstruction. No appendicitis. Vascular/Lymphatic: No acute vascular abnormality. Scattered atheromatous changes. No mass or adenopathy. Reproductive:No pathologic findings. Other: No ascites or pneumoperitoneum. Fatty enlargement of the right inguinal canal. There has been a left inguinal hernia repair using mesh. Musculoskeletal: No acute abnormalities. IMPRESSION: 1. 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 2. Large fatty right inguinal hernia with minimal involvement of the bladder dome. 3.  Aortic Atherosclerosis (ICD10-I70.0). Electronically Signed   By: Marnee Spring M.D.   On: 02/13/2020 06:50   DG Chest Portable 1 View  Result Date: 02/13/2020 CLINICAL DATA:  Upper abdominal pain EXAM: PORTABLE CHEST 1 VIEW COMPARISON:  Radiograph 06/17/2016 FINDINGS: Postsurgical changes related to prior CABG including intact and aligned sternotomy wires and multiple surgical clips projecting over the mediastinum. Could mediastinal contours are otherwise within normal limits for the portable technique. Some streaky opacities in the bases likely reflect atelectasis. No focal consolidation, features of edema, pneumothorax, or effusion. No acute osseous or soft tissue abnormality. Degenerative changes are present in the imaged spine and shoulders. Telemetry leads overlie the chest. IMPRESSION: Streaky opacities in the bases likely reflect atelectasis. No other acute cardiopulmonary abnormality. Electronically Signed   By: Kreg Shropshire M.D.   On: 02/13/2020 05:45    Time Spent in minutes  30   Susa Raring M.D on 02/14/2020 at 9:52 AM  To page go to [email or web address] - password [facility]

## 2020-02-14 NOTE — Plan of Care (Signed)
  Problem: Education: Goal: Knowledge of General Education information will improve Description Including pain rating scale, medication(s)/side effects and non-pharmacologic comfort measures Outcome: Progressing   

## 2020-02-14 NOTE — Progress Notes (Addendum)
ANTICOAGULATION CONSULT NOTE  Pharmacy Consult for Heparin Indication: Splenic Artery Infarct  Allergies  Allergen Reactions  . Sulfa Antibiotics Rash    Patient Measurements: Height: 5\' 9"  (175.3 cm) Weight: 113.4 kg (250 lb) IBW/kg (Calculated) : 70.7 Heparin Dosing Weight: 96 kg  Vital Signs: Temp: 99 F (37.2 C) (06/12 0948) Temp Source: Oral (06/12 0948) BP: 132/79 (06/12 0948) Pulse Rate: 66 (06/12 0948)  Labs: Recent Labs    02/13/20 0528 02/13/20 0725 02/13/20 0816 02/13/20 1405 02/13/20 1635 02/14/20 0108 02/14/20 0923  HGB 15.1  --   --   --   --  13.4  --   HCT 45.5  --   --   --   --  40.9  --   PLT 191  --   --   --   --  176  --   APTT  --   --  >200*  --   --   --   --   LABPROT  --   --  15.4*  --   --   --   --   INR  --   --  1.3*  --   --   --   --   HEPARINUNFRC  --   --   --    < > 1.01* 0.52 0.66  CREATININE 1.34*  --   --   --   --   --   --   TROPONINIHS 9 8  --   --   --   --   --    < > = values in this interval not displayed.    Estimated Creatinine Clearance: 72.8 mL/min (A) (by C-G formula based on SCr of 1.34 mg/dL (H)).   Medical History: Past Medical History:  Diagnosis Date  . Coronary artery disease     Assessment: 59 yr old male presented with epigastric pain, hx of CAD (S/P CABG).  CT showing large splenic artery infarct, likely branch occlusion.  Pt was not on anticoagulation PTA.  CBC WNL, INR 1.3, aPTT was >200 sec (but drawn right after heparin bolus given).   Today, heparin level remains therapeutic at 0.66 after rate change yesterday. Hgb and platelets WNL. No issues with the infusion or overt bleeding noted per RN.  Goal of Therapy:  Heparin level 0.3-0.7 units/ml Monitor platelets by anticoagulation protocol: Yes  Plan:  Continue heparin infusion at 1300 units/hr Monitor CBC, daily heparin level  Continue to monitor for signs/symptoms of bleeding F/u transition to oral anticoagulant  **Addendum: Pharmacy  now being consulted to transition to apixaban on 6/13 at 0700. Will plan to d/c heparin at 0700 on 6/13 and start apixaban at that time at 10mg  PO BID x 7 days then 5mg  PO bid thereafter**  7/13, PharmD PGY2 Pharmacy Resident Phone [phone]  02/14/2020   10:59 AM

## 2020-02-15 ENCOUNTER — Encounter (HOSPITAL_COMMUNITY): Payer: Self-pay

## 2020-02-15 LAB — BASIC METABOLIC PANEL
Anion gap: 10 (ref 5–15)
BUN: 8 mg/dL (ref 6–20)
CO2: 24 mmol/L (ref 22–32)
Calcium: 8.5 mg/dL — ABNORMAL LOW (ref 8.9–10.3)
Chloride: 100 mmol/L (ref 98–111)
Creatinine, Ser: 1.05 mg/dL (ref 0.61–1.24)
GFR calc Af Amer: >60 mL/min (ref >60)
GFR calc non Af Amer: >60 mL/min (ref >60)
Glucose, Bld: 116 mg/dL — ABNORMAL HIGH (ref 70–99)
Potassium: 3.8 mmol/L (ref 3.5–5.1)
Sodium: 134 mmol/L — ABNORMAL LOW (ref 135–145)

## 2020-02-15 LAB — CBC
HCT: 41.8 % (ref 39.0–52.0)
Hemoglobin: 13.7 g/dL (ref 13.0–17.0)
MCH: 29.2 pg (ref 26.0–34.0)
MCHC: 32.8 g/dL (ref 30.0–36.0)
MCV: 89.1 fL (ref 80.0–100.0)
Platelets: 156 10*3/uL (ref 150–400)
RBC: 4.69 MIL/uL (ref 4.22–5.81)
RDW: 11.9 % (ref 11.5–15.5)
WBC: 18.5 10*3/uL — ABNORMAL HIGH (ref 4.0–10.5)
nRBC: 0 % (ref 0.0–0.2)

## 2020-02-15 LAB — LIPID PANEL
Cholesterol: 123 mg/dL (ref 0–200)
HDL: 47 mg/dL (ref >40)
LDL Cholesterol: 67 mg/dL (ref 0–99)
Total CHOL/HDL Ratio: 2.6 RATIO
Triglycerides: 45 mg/dL (ref <150)
VLDL: 9 mg/dL (ref 0–40)

## 2020-02-15 LAB — MAGNESIUM: Magnesium: 2 mg/dL (ref 1.7–2.4)

## 2020-02-15 MED ORDER — PANTOPRAZOLE SODIUM 40 MG PO TBEC: 40.0000 mg | DELAYED_RELEASE_TABLET | Freq: Every day | ORAL | 0 refills | Status: AC

## 2020-02-15 MED ORDER — PANTOPRAZOLE SODIUM 40 MG PO TBEC
40.0000 mg | DELAYED_RELEASE_TABLET | Freq: Every day | ORAL | 0 refills | Status: DC
Start: 2020-02-15 — End: 2020-02-15

## 2020-02-15 MED ORDER — APIXABAN 5 MG PO TABS: 5.0000 mg | ORAL_TABLET | Freq: Two times a day (BID) | ORAL | 0 refills | Status: DC

## 2020-02-15 MED ORDER — APIXABAN 5 MG PO TABS: 10.0000 mg | ORAL_TABLET | Freq: Two times a day (BID) | ORAL | 0 refills | Status: DC

## 2020-02-15 NOTE — Discharge Instructions (Signed)
Follow with Primary MD Iva Lento, PA-C in 7 days   Follow with your cardiologist within a week of discharge.  Get your echo findings reviewed and your medications reviewed.  Get CBC, CMP, 2 view Chest X ray -  checked next visit within 1 week by Primary MD   Activity: As tolerated with Full fall precautions use walker/cane & assistance as needed  Disposition Home    Diet: Heart Healthy    Special Instructions: If you have smoked or chewed Tobacco  in the last 2 yrs please stop smoking, stop any regular Alcohol  and or any Recreational drug use.  On your next visit with your primary care physician please Get Medicines reviewed and adjusted.  Please request your Prim.MD to go over all Hospital Tests and Procedure/Radiological results at the follow up, please get all Hospital records sent to your Prim MD by signing hospital release before you go home.  If you experience worsening of your admission symptoms, develop shortness of breath, life threatening emergency, suicidal or homicidal thoughts you must seek medical attention immediately by calling 911 or calling your MD immediately  if symptoms less severe.  You Must read complete instructions/literature along with all the possible adverse reactions/side effects for all the Medicines you take and that have been prescribed to you. Take any new Medicines after you have completely understood and accpet all the possible adverse reactions/side effects.                                                                                 [place]                            [place], [place] [address]      Javier White was admitted to the Hospital on 02/13/2020 and Discharged  02/15/2020 and should be excused from work/school   for 7  days starting from date -  02/13/2020 , may return to work/school without any restrictions.  Call Lala Lund MD, [facility]   [phone] with questions.  Lala Lund M.D on 02/15/2020,at 8:58 AM  [facility]   Office  [phone]      Information on my medicine - ELIQUIS (apixaban)  This medication education was reviewed with me or my healthcare representative as part of my discharge preparation.   Why was Eliquis prescribed for you? Eliquis was prescribed to treat blood clots that may have been found in the veins of your legs (deep vein thrombosis) or in your lungs (pulmonary embolism) and to reduce the risk of them occurring again.  What do You need to know about Eliquis ? The starting dose is 10 mg (two 5 mg tablets) taken TWICE daily for the FIRST SEVEN (7) DAYS, then on 02/22/2020  the dose is reduced to ONE 5 mg tablet taken TWICE daily.  Eliquis may be taken with or without food.   Try to take the dose about the same time in the  morning and in the evening. If you have difficulty swallowing the tablet whole please discuss with your pharmacist how to take the medication safely.  Take Eliquis exactly as prescribed and DO NOT stop taking Eliquis without talking to the doctor who prescribed the medication.  Stopping may increase your risk of developing a new blood clot.  Refill your prescription before you run out.  After discharge, you should have regular check-up appointments with your healthcare provider that is prescribing your Eliquis.    What do you do if you miss a dose? If a dose of ELIQUIS is not taken at the scheduled time, take it as soon as possible on the same day and twice-daily administration should be resumed. The dose should not be doubled to make up for a missed dose.  Important Safety Information A possible side effect of Eliquis is bleeding. You should call your healthcare provider right away if you experience any of the following: ? Bleeding from an injury or your nose that does not stop. ? Unusual colored urine (red or dark brown) or unusual colored stools (red  or black). ? Unusual bruising for unknown reasons. ? A serious fall or if you hit your head (even if there is no bleeding).  Some medicines may interact with Eliquis and might increase your risk of bleeding or clotting while on Eliquis. To help avoid this, consult your healthcare provider or pharmacist prior to using any new prescription or non-prescription medications, including herbals, vitamins, non-steroidal anti-inflammatory drugs (NSAIDs) and supplements.  This website has more information on Eliquis (apixaban): http://www.eliquis.com/eliquis/home

## 2020-02-15 NOTE — Progress Notes (Signed)
Discharged patient to home, AVS given and explained with family at bedside. Belongings returned accordingly.

## 2020-02-15 NOTE — Discharge Summary (Signed)
Javier White IRW:[id number] DOB: April 26, 1961 DOA: 02/13/2020  PCP: Javier Lean, PA-C  Admit date: 02/13/2020  Discharge date: 02/15/2020  Admitted From: Home   Disposition:  Home   Recommendations for Outpatient Follow-up:   Follow up with PCP in 1-2 weeks  PCP Please obtain BMP/CBC, 2 view CXR in 1week,  (see Discharge instructions)   PCP Please follow up on the following pending results:    Home Health: None   Equipment/Devices: None  Consultations: Consulted vascular surgeon Dr. Chestine Spore over the phone and hematologist Dr. Nyra Capes switch over the phone Discharge Condition: Stable    CODE STATUS: Full    Diet Recommendation: Heart Healthy   Diet Order            Diet - low sodium heart healthy           DIET SOFT Room service appropriate? Yes; Fluid consistency: Thin  Diet effective now                  Chief Complaint  Patient presents with  . Abdominal Pain     Brief history of present illness from the day of admission and additional interim summary    [name] a59 y.o.male,history of CAD s/p MI and bypass surgery in 2017, hypertension, dyslipidemia, obesity with a BMI of 36, who was in good state of health until he woke up around 630 this morning with epigastric abdominal pain, presented to med [facility] ER where work-up suggested splenic artery occlusion with splenic infarct, he was started on heparin drip and transferred to [facility] for further care.                                                                  Hospital Course    1.Acute epigastric abdominal pain due to acute splenic artery occlusion with splenic infarct. I have discussed the case with both vascular surgeon Dr. Sherald Hess and hematologist Dr. Bertis Ruddy. No hypercoagulable work-up in  the acute setting this will be done outpatient, continue IV heparin switch to oral Eliquis if stable on 02/15/2020 and prepare for home discharge, case management to assist in procurement of Eliquis.  Stable A1c, stable on telemetry, LDL and echocardiogram stable emboli, does have some hypokinesis and EF is slightly on the lower side at 50% will have him follow with his cardiologist within a week and get echo reviewed.  Clinically he is much improved will be placed on Eliquis, continue home dose aspirin and statin, will add PPI and have him follow with his PCP in a week.  Since he has CAD for now continue aspirin although will defer to primary cardiologist on long-term aspirin and use with Eliquis being on board.  He will also follow with Dr. Bertis Ruddy hematologist in 7 to 10 days.  Lab Results  Component Value Date   HGBA1C 5.4 02/13/2020   Lab Results  Component Value Date   CHOL 123 02/15/2020   HDL 47 02/15/2020   LDLCALC 67 02/15/2020   TRIG 45 02/15/2020   CHOLHDL 2.6 02/15/2020    2.AKI. Resolved after hydration.  3.Hypertension. Continue home regimen.  4.CAD s/p bypass. No acute issues. Continue antiplatelet medication, beta-blocker and statin for secondary prevention.  Requested to follow with his cardiologist for a week to get echo report reviewed and aspirin continuation reviewed.  5.Obesity. BMI 36. Follow with PCP for weight loss.  Discharge diagnosis     Active Problems:   Abdominal pain   Acute thrombosis of splenic vein    Discharge instructions    Discharge Instructions    Diet - low sodium heart healthy   Complete by: As directed    Discharge instructions   Complete by: As directed    Follow with Primary MD Javier Lento, PA-C in 7 days   Follow with your cardiologist within a week of discharge.  Get your echo findings reviewed and your medications reviewed.  Get CBC, CMP, 2 view Chest X ray -  checked next visit within 1 week by  Primary MD   Activity: As tolerated with Full fall precautions use walker/cane & assistance as needed  Disposition Home    Diet: Heart Healthy    Special Instructions: If you have smoked or chewed Tobacco  in the last 2 yrs please stop smoking, stop any regular Alcohol  and or any Recreational drug use.  On your next visit with your primary care physician please Get Medicines reviewed and adjusted.  Please request your Prim.MD to go over all Hospital Tests and Procedure/Radiological results at the follow up, please get all Hospital records sent to your Prim MD by signing hospital release before you go home.  If you experience worsening of your admission symptoms, develop shortness of breath, life threatening emergency, suicidal or homicidal thoughts you must seek medical attention immediately by calling 911 or calling your MD immediately  if symptoms less severe.  You Must read complete instructions/literature along with all the possible adverse reactions/side effects for all the Medicines you take and that have been prescribed to you. Take any new Medicines after you have completely understood and accpet all the possible adverse reactions/side effects.   Increase activity slowly   Complete by: As directed       Discharge Medications   Allergies as of 02/15/2020      Reactions   Sulfa Antibiotics Rash      Medication List    TAKE these medications   acetaminophen 500 MG tablet Commonly known as: TYLENOL Take 1,000 mg by mouth every 8 (eight) hours as needed for mild pain.   apixaban 5 MG Tabs tablet Commonly known as: ELIQUIS Take 2 tablets (10 mg total) by mouth 2 (two) times daily for 7 days.   apixaban 5 MG Tabs tablet Commonly known as: ELIQUIS Take 1 tablet (5 mg total) by mouth 2 (two) times daily. Start taking on: February 23, 2020   aspirin 81 MG chewable tablet Chew 81 mg by mouth daily.   atorvastatin 40 MG tablet Commonly known as: LIPITOR Take 40 mg by mouth  daily.   lisinopril 5 MG tablet Commonly known as: ZESTRIL Take 5 mg by mouth daily.   metoprolol succinate 100 MG 24 hr tablet Commonly known as: TOPROL-XL Take 100 mg by mouth daily.   nitroGLYCERIN 0.4  MG SL tablet Commonly known as: NITROSTAT Place under the tongue.   pantoprazole 40 MG tablet Commonly known as: Protonix Take 1 tablet (40 mg total) by mouth daily.        Follow-up Information    Javier Lean, PA-C. Schedule an appointment as soon as possible for a visit in 1 week(s).   Specialty: Internal Medicine Why: Follow with your cardiologist within a week of discharge.  Get your echo findings reviewed and your medications reviewed. Contact information: [address] [place] [address] [phone]        Artis Delay, MD. Schedule an appointment as soon as possible for a visit in 1 week(s).   Specialty: Hematology and Oncology Contact information: [address] [place] [address] [phone]               Major procedures and Radiology Reports - PLEASE review detailed and final reports thoroughly  -        CT ABDOMEN PELVIS W CONTRAST  Result Date: 02/13/2020 CLINICAL DATA:  Upper abdominal pain EXAM: CT ABDOMEN AND PELVIS WITH CONTRAST TECHNIQUE: Multidetector CT imaging of the abdomen and pelvis was performed using the standard protocol following bolus administration of intravenous contrast. CONTRAST:  OMNIPAQUE IOHEXOL 300 MG/ML  SOLN COMPARISON:  None. FINDINGS: Lower chest: Fatty band of cross the apical left ventricle, subendocardial. There is history of cardiac surgery and a prior median sternotomy, presumably related. Hepatobiliary: No focal liver abnormality.No evidence of biliary obstruction or stone. Pancreas: Unremarkable. Spleen: Wedge-shaped area of non enhancement involving the central spleen, at least 50% of the volume. No hemorrhage is noted. There is an associated poorly enhancing splenic artery  branch at the hilum. No underlying dissection noted. Adrenals/Urinary Tract: Negative adrenals. No hydronephrosis or stone. 13 mm left renal cyst. The dome of the bladder is deviated into a right inguinal hernia. Stomach/Bowel:  No obstruction. No appendicitis. Vascular/Lymphatic: No acute vascular abnormality. Scattered atheromatous changes. No mass or adenopathy. Reproductive:No pathologic findings. Other: No ascites or pneumoperitoneum. Fatty enlargement of the right inguinal canal. There has been a left inguinal hernia repair using mesh. Musculoskeletal: No acute abnormalities. IMPRESSION: 1. 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 2. Large fatty right inguinal hernia with minimal involvement of the bladder dome. 3.  Aortic Atherosclerosis (ICD10-I70.0). Electronically Signed   By: Marnee Spring M.D.   On: 02/13/2020 06:50   DG Chest Portable 1 View  Result Date: 02/13/2020 CLINICAL DATA:  Upper abdominal pain EXAM: PORTABLE CHEST 1 VIEW COMPARISON:  Radiograph 06/17/2016 FINDINGS: Postsurgical changes related to prior CABG including intact and aligned sternotomy wires and multiple surgical clips projecting over the mediastinum. Could mediastinal contours are otherwise within normal limits for the portable technique. Some streaky opacities in the bases likely reflect atelectasis. No focal consolidation, features of edema, pneumothorax, or effusion. No acute osseous or soft tissue abnormality. Degenerative changes are present in the imaged spine and shoulders. Telemetry leads overlie the chest. IMPRESSION: Streaky opacities in the bases likely reflect atelectasis. No other acute cardiopulmonary abnormality. Electronically Signed   By: Kreg Shropshire M.D.   On: 02/13/2020 05:45   ECHOCARDIOGRAM COMPLETE  Result Date: 02/14/2020    ECHOCARDIOGRAM REPORT   Patient Name:   Javier White Date of Exam: 02/14/2020 Medical Rec #:  [id number]              Height:        69.0 in Accession #:  [id number]             Weight:       250.0 lb Date of Birth:  [birth year]              BSA:          2.272 m Patient Age:    59 years               BP:           143/82 mmHg Patient Gender: M                      HR:           86 bpm. Exam Location:  Inpatient Procedure: 2D Echo, Cardiac Doppler, Color Doppler and Intracardiac            Opacification Agent Indications:    CHF-Acute diastolic  History:        Patient has no prior history of Echocardiogram examinations. CAD                 and Previous Myocardial Infarction, Prior CABG; Risk                 Factors:Hypertension and Dyslipidemia.  Sonographer:    Ross Ludwig RDCS (AE) Referring Phys: [id number] [facility]  Sonographer Comments: Suboptimal apical window and suboptimal subcostal window. IMPRESSIONS  1. Distal septal and apical akinesis . Left ventricular ejection fraction, by estimation, is 50 to 55%. The left ventricle has low normal function. The left ventricle demonstrates regional wall motion abnormalities (see scoring diagram/findings for description). The left ventricular internal cavity size was mildly dilated. There is mild left ventricular hypertrophy. Left ventricular diastolic parameters were normal.  2. Right ventricular systolic function is normal. The right ventricular size is normal.  3. Left atrial size was moderately dilated.  4. The mitral valve is normal in structure. Trivial mitral valve regurgitation. No evidence of mitral stenosis.  5. The aortic valve is tricuspid. Aortic valve regurgitation is trivial. No aortic stenosis is present.  6. The inferior vena cava is normal in size with greater than 50% respiratory variability, suggesting right atrial pressure of 3 mmHg. FINDINGS  Left Ventricle: Distal septal and apical akinesis. Left ventricular ejection fraction, by estimation, is 50 to 55%. The left ventricle has low normal function. The left ventricle demonstrates regional wall motion abnormalities.  Definity contrast agent was given IV to delineate the left ventricular endocardial borders. The left ventricular internal cavity size was mildly dilated. There is mild left ventricular hypertrophy. Left ventricular diastolic parameters were normal. Right Ventricle: The right ventricular size is normal. No increase in right ventricular wall thickness. Right ventricular systolic function is normal. Left Atrium: Left atrial size was moderately dilated. Right Atrium: Right atrial size was normal in size. Pericardium: There is no evidence of pericardial effusion. Mitral Valve: The mitral valve is normal in structure. Normal mobility of the mitral valve leaflets. Trivial mitral valve regurgitation. No evidence of mitral valve stenosis. MV peak gradient, 4.4 mmHg. The mean mitral valve gradient is 2.0 mmHg. Tricuspid Valve: The tricuspid valve is normal in structure. Tricuspid valve regurgitation is trivial. No evidence of tricuspid stenosis. Aortic Valve: The aortic valve is tricuspid. Aortic valve regurgitation is trivial. No aortic stenosis is present. Aortic valve mean gradient measures 4.0 mmHg. Aortic valve peak gradient measures 7.6 mmHg. Aortic valve area, by VTI measures 2.90 cm. Pulmonic Valve: The pulmonic valve was normal in  structure. Pulmonic valve regurgitation is not visualized. No evidence of pulmonic stenosis. Aorta: The aortic root is normal in size and structure. Venous: The inferior vena cava is normal in size with greater than 50% respiratory variability, suggesting right atrial pressure of 3 mmHg. IAS/Shunts: No atrial level shunt detected by color flow Doppler.  LEFT VENTRICLE PLAX 2D LVIDd:         4.30 cm  Diastology LVIDs:         3.00 cm  LV e' lateral:   13.10 cm/s LV PW:         1.20 cm  LV E/e' lateral: 6.8 LV IVS:        1.20 cm  LV e' medial:    7.07 cm/s LVOT diam:     2.00 cm  LV E/e' medial:  12.5 LV SV:         79 LV SV Index:   35 LVOT Area:     3.14 cm  RIGHT VENTRICLE RV Basal  diam:  2.80 cm RV S prime:     11.50 cm/s TAPSE (M-mode): 2.2 cm LEFT ATRIUM             Index       RIGHT ATRIUM           Index LA diam:        4.40 cm 1.94 cm/m  RA Area:     14.40 cm LA Vol (A2C):   50.0 ml 22.01 ml/m RA Volume:   34.80 ml  15.32 ml/m LA Vol (A4C):   68.6 ml 30.20 ml/m LA Biplane Vol: 60.4 ml 26.59 ml/m  AORTIC VALVE AV Area (Vmax):    2.71 cm AV Area (Vmean):   2.71 cm AV Area (VTI):     2.90 cm AV Vmax:           138.00 cm/s AV Vmean:          91.100 cm/s AV VTI:            0.272 m AV Peak Grad:      7.6 mmHg AV Mean Grad:      4.0 mmHg LVOT Vmax:         119.00 cm/s LVOT Vmean:        78.600 cm/s LVOT VTI:          0.251 m LVOT/AV VTI ratio: 0.92  AORTA Ao Root diam: 3.20 cm Ao Asc diam:  3.10 cm MITRAL VALVE MV Area (PHT): 5.27 cm    SHUNTS MV Peak grad:  4.4 mmHg    Systemic VTI:  0.25 m MV Mean grad:  2.0 mmHg    Systemic Diam: 2.00 cm MV Vmax:       1.05 m/s MV Vmean:      58.2 cm/s MV Decel Time: 144 msec MV E velocity: 88.70 cm/s MV A velocity: 86.50 cm/s MV E/A ratio:  1.03 Charlton Haws MD Electronically signed by Charlton Haws MD Signature Date/Time: 02/14/2020/12:09:01 PM    Final     Micro Results     Recent Results (from the past 240 hour(s))  SARS Coronavirus 2 by RT PCR (hospital order, performed in [facility] hospital lab) Nasopharyngeal Nasopharyngeal Swab     Status: None   Collection Time: 02/13/20  7:44 AM   Specimen: Nasopharyngeal Swab  Result Value Ref Range Status   SARS Coronavirus 2 NEGATIVE NEGATIVE Final    Comment: (NOTE) SARS-CoV-2 target nucleic acids are NOT DETECTED.  The SARS-CoV-2 RNA  is generally detectable in upper and lower respiratory specimens during the acute phase of infection. The lowest concentration of SARS-CoV-2 viral copies this assay can detect is 250 copies / mL. A negative result does not preclude SARS-CoV-2 infection and should not be used as the sole basis for treatment or other patient management decisions.  A  negative result may occur with improper specimen collection / handling, submission of specimen other than nasopharyngeal swab, presence of viral mutation(s) within the areas targeted by this assay, and inadequate number of viral copies (<250 copies / mL). A negative result must be combined with clinical observations, patient history, and epidemiological information.  Fact Sheet for Patients:   BoilerBrush.com.cy  Fact Sheet for Healthcare Providers: https://pope.com/  This test is not yet approved or  cleared by the [place] FDA and has been authorized for detection and/or diagnosis of SARS-CoV-2 by FDA under an Emergency Use Authorization (EUA).  This EUA will remain in effect (meaning this test can be used) for the duration of the COVID-19 declaration under Section 564(b)(1) of the Act, 21 U.S.C. section 360bbb-3(b)(1), unless the authorization is terminated or revoked sooner.  Performed at [facility], [address] [place], [place] [address]     Today   Subjective    Kwaku Mostafa today has no headache,no chest  ,no new weakness tingling or numbness, abdominal discomfort much improved, feels much better wants to go home today.     Objective   Blood pressure 110/70, pulse 70, temperature 98.4 F (36.9 C), temperature source Oral, resp. rate 18, height  (1.753 m), weight 113.4 kg, SpO2 96 %.   Intake/Output Summary (Last 24 hours) at 02/15/2020 0924 Last data filed at 02/14/2020 1500 Gross per 24 hour  Intake 617.1 ml  Output --  Net 617.1 ml    Exam  Awake Alert, No new F.N deficits, Normal affect [place].AT,PERRAL Supple Neck,No JVD, No cervical lymphadenopathy appriciated.  Symmetrical Chest wall movement, Good air movement bilaterally, CTAB RRR,No Gallops,Rubs or new Murmurs, No Parasternal Heave +ve B.Sounds, Abd Soft, minimal epigastric tenderness on deep palpation, No organomegaly  appriciated, No rebound -guarding or rigidity. No Cyanosis, Clubbing or edema, No new Rash or bruise   Data Review   CBC w Diff:  Lab Results  Component Value Date   WBC 18.5 (H) 02/15/2020   HGB 13.7 02/15/2020   HCT 41.8 02/15/2020   PLT 156 02/15/2020   LYMPHOPCT 12 02/13/2020   MONOPCT 6 02/13/2020   EOSPCT 1 02/13/2020   BASOPCT 1 02/13/2020    CMP:  Lab Results  Component Value Date   NA 134 (L) 02/15/2020   K 3.8 02/15/2020   CL 100 02/15/2020   CO2 24 02/15/2020   BUN 8 02/15/2020   CREATININE 1.05 02/15/2020   PROT 7.2 02/13/2020   ALBUMIN 4.0 02/13/2020   BILITOT 0.7 02/13/2020   ALKPHOS 86 02/13/2020   AST 38 02/13/2020   ALT 47 (H) 02/13/2020  .   Total Time in preparing paper work, data evaluation and todays exam - 35 minutes  Susa Raring M.D on 02/15/2020 at 9:24 AM  [facility]   Office  [phone]

## 2020-02-15 NOTE — Care Management (Signed)
Patient given Eliquis copay and 30 day free cards.  Patient's pharmacy will need to be changed as [facility] is not open today. Patient and RN aware

## 2020-02-24 ENCOUNTER — Other Ambulatory Visit: Payer: Self-pay

## 2020-02-24 ENCOUNTER — Encounter: Payer: Self-pay | Admitting: Hematology and Oncology

## 2020-02-24 ENCOUNTER — Inpatient Hospital Stay: Payer: 59 | Attending: Hematology and Oncology | Admitting: Hematology and Oncology

## 2020-02-24 DIAGNOSIS — D735 Infarction of spleen: Secondary | ICD-10-CM | POA: Diagnosis not present

## 2020-02-24 DIAGNOSIS — E669 Obesity, unspecified: Secondary | ICD-10-CM | POA: Diagnosis not present

## 2020-02-24 DIAGNOSIS — Z951 Presence of aortocoronary bypass graft: Secondary | ICD-10-CM | POA: Diagnosis not present

## 2020-02-24 DIAGNOSIS — Z7982 Long term (current) use of aspirin: Secondary | ICD-10-CM | POA: Insufficient documentation

## 2020-02-24 DIAGNOSIS — Z8249 Family history of ischemic heart disease and other diseases of the circulatory system: Secondary | ICD-10-CM | POA: Diagnosis not present

## 2020-02-24 DIAGNOSIS — Z7901 Long term (current) use of anticoagulants: Secondary | ICD-10-CM | POA: Diagnosis not present

## 2020-02-24 DIAGNOSIS — I252 Old myocardial infarction: Secondary | ICD-10-CM | POA: Diagnosis not present

## 2020-02-24 NOTE — Progress Notes (Signed)
[facility] CONSULT NOTE  Patient Care Team: Javier Lean, PA-C as PCP - General (Internal Medicine)  Assessment and plan  Splenic infarct I reviewed multiple imaging studies and test results with the patient and his wife The most likely cause of the splenic infarct is arterial thrombotic event originating from his heart Echocardiogram revealed area of hypokinesis Even though he was taking aspirin therapy after his heart attack, his sedentary lifestyle and class II obesity likely contributed to recent arterial event For now, he will continue apixaban I plan to see him back again in 6 months for further follow-up and we will make a decision at the time whether it is appropriate to discontinue apixaban or to continue on it indefinitely at reduced dose  Class II obesity We have extensive discussions about risk factors for recurrent thrombosis His current risk factor include sedentary lifestyle and class II obesity We discussed the risk and benefits of referral to weight management center The patient appears motivated He was able to lose a lot of weight after his heart attack several years ago He will work with his wife to lose some weight before I see him back in the next visit   All questions were answered. The patient knows to call the clinic with any problems, questions or concerns. The total time spent in the appointment was 55 minutes encounter with patients including review of chart and various tests results, discussions about plan of care and coordination of care plan  Javier Delay, MD 02/24/2020  CHIEF COMPLAINTS/PURPOSE OF CONSULTATION:  Acute splenic infarct  HISTORY OF PRESENTING ILLNESS:  Javier White 59 y.o. male is here because of recent diagnosis of splenic infarct Javier White is here accompanied by his wife, Javier White His cardiovascular risk factors included history of cardiac bypass surgery after heart attack.  He has strong family history of heart  attack.  His father had heart attack at the age of 69 and brother died from heart attack at the age of 34 The patient does not smoke After his heart bypass surgery, he lost a lot of weight down to about 175 pounds but slowly gained a lot of weight over the last few years He is taking medications for cholesterol and blood pressure He was taking aspirin therapy prior to diagnosis He woke up in the middle of the night complaining of severe abdominal pain.  He rated his pain at 8-9 out of 10 pain and was subsequently brought to the emergency department for evaluation He has CT scan of the abdomen and pelvis on February 13, 2020 which showed: 1. 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 2. Large fatty right inguinal hernia with minimal involvement of the bladder dome. 3.  Aortic Atherosclerosis (ICD10-I70.0).  He denies lower extremity swelling, warmth, tenderness & erythema.  He denies recent chest pain on exertion, shortness of breath on minimal exertion, pre-syncopal episodes, hemoptysis, or palpitation. He denies recent history of trauma, long distance travel, dehydration, recent surgery, smoking or prolonged immobilization. He had no prior history or diagnosis of cancer. His age appropriate screening programs are up-to-date. He had prior surgeries before and never had perioperative thromboembolic events. He is not on testosterone replacement therapy The patient has no family history of blood clots or miscarriages, except for heart attack in his brother and father as above  He was admitted and was anticoagulated.  His pain went away The patient works in an office job.  He is sedentary  MEDICAL HISTORY:  Past Medical History:  Diagnosis Date  . Coronary artery disease     SURGICAL HISTORY: Past Surgical History:  Procedure Laterality Date  . APPENDECTOMY    . CARDIAC SURGERY      SOCIAL HISTORY: Social History   Socioeconomic History  . Marital  status: Married    Spouse name: Javier White  . Number of children: 2  . Years of education: Not on file  . Highest education level: Not on file  Occupational History  . Not on file  Tobacco Use  . Smoking status: Never Smoker  . Smokeless tobacco: Never Used  Substance and Sexual Activity  . Alcohol use: Never  . Drug use: Never  . Sexual activity: Not on file  Other Topics Concern  . Not on file  Social History Narrative  . Not on file   Social Determinants of Health   Financial Resource Strain:   . Difficulty of Paying Living Expenses:   Food Insecurity:   . Worried About Programme researcher, broadcasting/film/video in the Last Year:   . Barista in the Last Year:   Transportation Needs:   . Freight forwarder (Medical):   Marland Kitchen Lack of Transportation (Non-Medical):   Physical Activity:   . Days of Exercise per Week:   . Minutes of Exercise per Session:   Stress:   . Feeling of Stress :   Social Connections:   . Frequency of Communication with Friends and Family:   . Frequency of Social Gatherings with Friends and Family:   . Attends Religious Services:   . Active Member of Clubs or Organizations:   . Attends Banker Meetings:   Marland Kitchen Marital Status:   Intimate Partner Violence:   . Fear of Current or Ex-Partner:   . Emotionally Abused:   Marland Kitchen Physically Abused:   . Sexually Abused:     FAMILY HISTORY: Family History  Problem Relation Age of Onset  . Heart attack Father   . Heart attack Brother     ALLERGIES:  is allergic to sulfa antibiotics.  MEDICATIONS:  Current Outpatient Medications  Medication Sig Dispense Refill  . acetaminophen (TYLENOL) 500 MG tablet Take 1,000 mg by mouth every 8 (eight) hours as needed for mild pain.    Marland Kitchen apixaban (ELIQUIS) 5 MG TABS tablet Take 2 tablets (10 mg total) by mouth 2 (two) times daily for 7 days. 28 tablet 0  . apixaban (ELIQUIS) 5 MG TABS tablet Take 1 tablet (5 mg total) by mouth 2 (two) times daily. 60 tablet 0  . aspirin 81  MG chewable tablet Chew 81 mg by mouth daily.     Marland Kitchen atorvastatin (LIPITOR) 40 MG tablet Take 40 mg by mouth daily.     Marland Kitchen lisinopril (ZESTRIL) 5 MG tablet Take 5 mg by mouth daily.     . metoprolol succinate (TOPROL-XL) 100 MG 24 hr tablet Take 100 mg by mouth daily.     . nitroGLYCERIN (NITROSTAT) 0.4 MG SL tablet Place under the tongue.    . pantoprazole (PROTONIX) 40 MG tablet Take 1 tablet (40 mg total) by mouth daily. 30 tablet 0   No current facility-administered medications for this visit.    REVIEW OF SYSTEMS:   Constitutional: Denies fevers, chills or abnormal night sweats Eyes: Denies blurriness of vision, double vision or watery eyes Ears, nose, mouth, throat, and face: Denies mucositis or sore throat Respiratory: Denies cough, dyspnea or wheezes Cardiovascular: Denies palpitation, chest discomfort or lower extremity swelling  Gastrointestinal:  Denies nausea, heartburn or change in bowel habits Skin: Denies abnormal skin rashes Lymphatics: Denies new lymphadenopathy or easy bruising Neurological:Denies numbness, tingling or new weaknesses Behavioral/Psych: Mood is stable, no new changes  All other systems were reviewed with the patient and are negative.  PHYSICAL EXAMINATION: ECOG PERFORMANCE STATUS: 0 - Asymptomatic  Vitals:   02/24/20 1241  BP: 131/74  Pulse: 61  Resp: 18  SpO2: 99%   Filed Weights   02/24/20 1241  Weight: 239 lb 6.4 oz (108.6 kg)    GENERAL:alert, no distress and comfortable.  He has central obesity SKIN: skin color, texture, turgor are normal, no rashes or significant lesions EYES: normal, conjunctiva are pink and non-injected, sclera clear OROPHARYNX:no exudate, no erythema and lips, buccal mucosa, and tongue normal  NECK: supple, thyroid normal size, non-tender, without nodularity LYMPH:  no palpable lymphadenopathy in the cervical, axillary or inguinal LUNGS: clear to auscultation and percussion with normal breathing effort HEART: regular  rate & rhythm and no murmurs and no lower extremity edema ABDOMEN:abdomen soft, non-tender and normal bowel sounds Musculoskeletal:no cyanosis of digits and no clubbing  PSYCH: alert & oriented x 3 with fluent speech NEURO: no focal motor/sensory deficits  LABORATORY DATA:  I have reviewed the data as listed Lab Results  Component Value Date   WBC 18.5 (H) 02/15/2020   HGB 13.7 02/15/2020   HCT 41.8 02/15/2020   MCV 89.1 02/15/2020   PLT 156 02/15/2020     RADIOGRAPHIC STUDIES: I reviewed multiple imaging studies with the patient and his wife I have personally reviewed the radiological images as listed and agreed with the findings in the report. CT ABDOMEN PELVIS W CONTRAST  Result Date: 02/13/2020 CLINICAL DATA:  Upper abdominal pain EXAM: CT ABDOMEN AND PELVIS WITH CONTRAST TECHNIQUE: Multidetector CT imaging of the abdomen and pelvis was performed using the standard protocol following bolus administration of intravenous contrast. CONTRAST:  121mL OMNIPAQUE IOHEXOL 300 MG/ML  SOLN COMPARISON:  None. FINDINGS: Lower chest: Fatty band of cross the apical left ventricle, subendocardial. There is history of cardiac surgery and a prior median sternotomy, presumably related. Hepatobiliary: No focal liver abnormality.No evidence of biliary obstruction or stone. Pancreas: Unremarkable. Spleen: Wedge-shaped area of non enhancement involving the central spleen, at least 50% of the volume. No hemorrhage is noted. There is an associated poorly enhancing splenic artery branch at the hilum. No underlying dissection noted. Adrenals/Urinary Tract: Negative adrenals. No hydronephrosis or stone. 13 mm left renal cyst. The dome of the bladder is deviated into a right inguinal hernia. Stomach/Bowel:  No obstruction. No appendicitis. Vascular/Lymphatic: No acute vascular abnormality. Scattered atheromatous changes. No mass or adenopathy. Reproductive:No pathologic findings. Other: No ascites or pneumoperitoneum.  Fatty enlargement of the right inguinal canal. There has been a left inguinal hernia repair using mesh. Musculoskeletal: No acute abnormalities. IMPRESSION: 1. 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 2. Large fatty right inguinal hernia with minimal involvement of the bladder dome. 3.  Aortic Atherosclerosis (ICD10-I70.0). Electronically Signed   By: Monte Fantasia M.D.   On: 02/13/2020 06:50   DG Chest Portable 1 View  Result Date: 02/13/2020 CLINICAL DATA:  Upper abdominal pain EXAM: PORTABLE CHEST 1 VIEW COMPARISON:  Radiograph 06/17/2016 FINDINGS: Postsurgical changes related to prior CABG including intact and aligned sternotomy wires and multiple surgical clips projecting over the mediastinum. Could mediastinal contours are otherwise within normal limits for the portable technique. Some streaky opacities in the bases likely reflect atelectasis.  No focal consolidation, features of edema, pneumothorax, or effusion. No acute osseous or soft tissue abnormality. Degenerative changes are present in the imaged spine and shoulders. Telemetry leads overlie the chest. IMPRESSION: Streaky opacities in the bases likely reflect atelectasis. No other acute cardiopulmonary abnormality. Electronically Signed   By: Kreg Shropshire M.D.   On: 02/13/2020 05:45   ECHOCARDIOGRAM COMPLETE  Result Date: 02/14/2020    ECHOCARDIOGRAM REPORT   Patient Name:   Javier White Date of Exam: 02/14/2020 Medical Rec #:  [id number]              Height:       69.0 in Accession #:    [id number]             Weight:       250.0 lb Date of Birth:  [birth year]              BSA:          2.272 m Patient Age:    59 years               BP:           143/82 mmHg Patient Gender: M                      HR:           86 bpm. Exam Location:  Inpatient Procedure: 2D Echo, Cardiac Doppler, Color Doppler and Intracardiac            Opacification Agent Indications:    CHF-Acute diastolic  History:         Patient has no prior history of Echocardiogram examinations. CAD                 and Previous Myocardial Infarction, Prior CABG; Risk                 Factors:Hypertension and Dyslipidemia.  Sonographer:    Ross Ludwig RDCS (AE) Referring Phys: [id number] [facility]  Sonographer Comments: Suboptimal apical window and suboptimal subcostal window. IMPRESSIONS  1. Distal septal and apical akinesis . Left ventricular ejection fraction, by estimation, is 50 to 55%. The left ventricle has low normal function. The left ventricle demonstrates regional wall motion abnormalities (see scoring diagram/findings for description). The left ventricular internal cavity size was mildly dilated. There is mild left ventricular hypertrophy. Left ventricular diastolic parameters were normal.  2. Right ventricular systolic function is normal. The right ventricular size is normal.  3. Left atrial size was moderately dilated.  4. The mitral valve is normal in structure. Trivial mitral valve regurgitation. No evidence of mitral stenosis.  5. The aortic valve is tricuspid. Aortic valve regurgitation is trivial. No aortic stenosis is present.  6. The inferior vena cava is normal in size with greater than 50% respiratory variability, suggesting right atrial pressure of 3 mmHg. FINDINGS  Left Ventricle: Distal septal and apical akinesis. Left ventricular ejection fraction, by estimation, is 50 to 55%. The left ventricle has low normal function. The left ventricle demonstrates regional wall motion abnormalities. Definity contrast agent was given IV to delineate the left ventricular endocardial borders. The left ventricular internal cavity size was mildly dilated. There is mild left ventricular hypertrophy. Left ventricular diastolic parameters were normal. Right Ventricle: The right ventricular size is normal. No increase in right ventricular wall thickness. Right ventricular systolic function is normal. Left Atrium: Left atrial size was moderately  dilated. Right Atrium: Right atrial  size was normal in size. Pericardium: There is no evidence of pericardial effusion. Mitral Valve: The mitral valve is normal in structure. Normal mobility of the mitral valve leaflets. Trivial mitral valve regurgitation. No evidence of mitral valve stenosis. MV peak gradient, 4.4 mmHg. The mean mitral valve gradient is 2.0 mmHg. Tricuspid Valve: The tricuspid valve is normal in structure. Tricuspid valve regurgitation is trivial. No evidence of tricuspid stenosis. Aortic Valve: The aortic valve is tricuspid. Aortic valve regurgitation is trivial. No aortic stenosis is present. Aortic valve mean gradient measures 4.0 mmHg. Aortic valve peak gradient measures 7.6 mmHg. Aortic valve area, by VTI measures 2.90 cm. Pulmonic Valve: The pulmonic valve was normal in structure. Pulmonic valve regurgitation is not visualized. No evidence of pulmonic stenosis. Aorta: The aortic root is normal in size and structure. Venous: The inferior vena cava is normal in size with greater than 50% respiratory variability, suggesting right atrial pressure of 3 mmHg. IAS/Shunts: No atrial level shunt detected by color flow Doppler.  LEFT VENTRICLE PLAX 2D LVIDd:         4.30 cm  Diastology LVIDs:         3.00 cm  LV e' lateral:   13.10 cm/s LV PW:         1.20 cm  LV E/e' lateral: 6.8 LV IVS:        1.20 cm  LV e' medial:    7.07 cm/s LVOT diam:     2.00 cm  LV E/e' medial:  12.5 LV SV:         79 LV SV Index:   35 LVOT Area:     3.14 cm  RIGHT VENTRICLE RV Basal diam:  2.80 cm RV S prime:     11.50 cm/s TAPSE (M-mode): 2.2 cm LEFT ATRIUM             Index       RIGHT ATRIUM           Index LA diam:        4.40 cm 1.94 cm/m  RA Area:     14.40 cm LA Vol (A2C):   50.0 ml 22.01 ml/m RA Volume:   34.80 ml  15.32 ml/m LA Vol (A4C):   68.6 ml 30.20 ml/m LA Biplane Vol: 60.4 ml 26.59 ml/m  AORTIC VALVE AV Area (Vmax):    2.71 cm AV Area (Vmean):   2.71 cm AV Area (VTI):     2.90 cm AV Vmax:            138.00 cm/s AV Vmean:          91.100 cm/s AV VTI:            0.272 m AV Peak Grad:      7.6 mmHg AV Mean Grad:      4.0 mmHg LVOT Vmax:         119.00 cm/s LVOT Vmean:        78.600 cm/s LVOT VTI:          0.251 m LVOT/AV VTI ratio: 0.92  AORTA Ao Root diam: 3.20 cm Ao Asc diam:  3.10 cm MITRAL VALVE MV Area (PHT): 5.27 cm    SHUNTS MV Peak grad:  4.4 mmHg    Systemic VTI:  0.25 m MV Mean grad:  2.0 mmHg    Systemic Diam: 2.00 cm MV Vmax:       1.05 m/s MV Vmean:      58.2 cm/s MV Decel Time:  144 msec MV E velocity: 88.70 cm/s MV A velocity: 86.50 cm/s MV E/A ratio:  1.03 Charlton Haws MD Electronically signed by Charlton Haws MD Signature Date/Time: 02/14/2020/12:09:01 PM    Final

## 2020-02-24 NOTE — Assessment & Plan Note (Signed)
We have extensive discussions about risk factors for recurrent thrombosis His current risk factor include sedentary lifestyle and class II obesity We discussed the risk and benefits of referral to weight management center The patient appears motivated He was able to lose a lot of weight after his heart attack several years ago He will work with his wife to lose some weight before I see him back in the next visit

## 2020-02-24 NOTE — Assessment & Plan Note (Signed)
I reviewed multiple imaging studies and test results with the patient and his wife The most likely cause of the splenic infarct is arterial thrombotic event originating from his heart Echocardiogram revealed area of hypokinesis Even though he was taking aspirin therapy after his heart attack, his sedentary lifestyle and class II obesity likely contributed to recent arterial event For now, he will continue apixaban I plan to see him back again in 6 months for further follow-up and we will make a decision at the time whether it is appropriate to discontinue apixaban or to continue on it indefinitely at reduced dose

## 2020-02-26 ENCOUNTER — Telehealth: Payer: Self-pay | Admitting: Hematology and Oncology

## 2020-02-26 NOTE — Telephone Encounter (Signed)
Scheduled per 6/22 sch msg. Called and left a voicemail, mailing appt letter and calendar printout  

## 2020-08-25 ENCOUNTER — Inpatient Hospital Stay: Payer: 59 | Attending: Hematology and Oncology | Admitting: Hematology and Oncology

## 2020-08-25 ENCOUNTER — Encounter: Payer: Self-pay | Admitting: Hematology and Oncology

## 2020-08-25 ENCOUNTER — Other Ambulatory Visit: Payer: Self-pay

## 2020-08-25 DIAGNOSIS — I219 Acute myocardial infarction, unspecified: Secondary | ICD-10-CM | POA: Diagnosis not present

## 2020-08-25 DIAGNOSIS — Z7901 Long term (current) use of anticoagulants: Secondary | ICD-10-CM | POA: Diagnosis not present

## 2020-08-25 DIAGNOSIS — Z8249 Family history of ischemic heart disease and other diseases of the circulatory system: Secondary | ICD-10-CM | POA: Insufficient documentation

## 2020-08-25 DIAGNOSIS — D735 Infarction of spleen: Secondary | ICD-10-CM | POA: Diagnosis not present

## 2020-08-25 DIAGNOSIS — Z79899 Other long term (current) drug therapy: Secondary | ICD-10-CM | POA: Insufficient documentation

## 2020-08-25 DIAGNOSIS — Z7982 Long term (current) use of aspirin: Secondary | ICD-10-CM | POA: Insufficient documentation

## 2020-08-25 DIAGNOSIS — I252 Old myocardial infarction: Secondary | ICD-10-CM | POA: Diagnosis not present

## 2020-08-25 MED ORDER — APIXABAN 5 MG PO TABS: 5.0000 mg | ORAL_TABLET | Freq: Two times a day (BID) | ORAL | 0 refills | Status: AC

## 2020-08-25 NOTE — Progress Notes (Signed)
[facility] OFFICE PROGRESS NOTE  Javier Lean, PA-C  ASSESSMENT & PLAN:  Splenic infarct The most likely cause of the splenic infarct is arterial thrombotic event originating from his heart Echocardiogram revealed area of hypokinesis Even though he was taking aspirin therapy after his heart attack, his sedentary lifestyle and class II obesity likely contributed to recent arterial event He is doing well without further pain or bleeding complications Unfortunately, he remains sedentary and has not lost any weight I recommend continuing apixaban indefinitely as secondary prevention We discussed the rationale of not repeating imaging study as it would not change management We discussed 2.5 mg dose versus 5 mg dose Due to his class II obesity, I recommend he continues at 5 mg dose However, if the patient start to experience renal dysfunction or bleeding complications, then I would recommend reducing the dose to 2.5 mg twice a day We discussed interruption of anticoagulation therapy in the setting of surgery; in those situations, he is instructed to call me I refill his prescription for 1 more month If his primary care doctor is willing to refill it, he does not need to return However, if his primary doctor felt he should see me for long-term visit, I will see him back in a year I have addressed all his questions and concerns   No orders of the defined types were placed in this encounter.   The total time spent in the appointment was 20 minutes encounter with patients including review of chart and various tests results, discussions about plan of care and coordination of care plan   All questions were answered. The patient knows to call the clinic with any problems, questions or concerns. No barriers to learning was detected.    Artis Delay, MD 12/22/20211:24 PM  INTERVAL HISTORY: Javier White 58 y.o. male returns for further follow-up on chronic  anticoagulation therapy in the setting of splenic infarct Since last time I saw him, he has been doing well Denies abdominal pain No recent bleeding complications He has not lost any weight He remains somewhat sedentary  SUMMARY OF HEMATOLOGIC HISTORY: Javier White 59 y.o. male is here because of recent diagnosis of splenic infarct Javier White is here accompanied by his wife, Javier White His cardiovascular risk factors included history of cardiac bypass surgery after heart attack.  He has strong family history of heart attack.  His father had heart attack at the age of 37 and brother died from heart attack at the age of 10 The patient does not smoke After his heart bypass surgery, he lost a lot of weight down to about 175 pounds but slowly gained a lot of weight over the last few years He is taking medications for cholesterol and blood pressure He was taking aspirin therapy prior to diagnosis He woke up in the middle of the night complaining of severe abdominal pain.  He rated his pain at 8-9 out of 10 pain and was subsequently brought to the emergency department for evaluation He has CT scan of the abdomen and pelvis on February 13, 2020 which showed: 1. 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 2. Large fatty right inguinal hernia with minimal involvement of the bladder dome. 3.  Aortic Atherosclerosis (ICD10-I70.0).  He denies lower extremity swelling, warmth, tenderness & erythema.  He denies recent chest pain on exertion, shortness of breath on minimal exertion, pre-syncopal episodes, hemoptysis, or palpitation. He denies recent history of trauma, long distance travel, dehydration, recent  surgery, smoking or prolonged immobilization. He had no prior history or diagnosis of cancer. His age appropriate screening programs are up-to-date. He had prior surgeries before and never had perioperative thromboembolic events. He is not on testosterone replacement  therapy The patient has no family history of blood clots or miscarriages, except for heart attack in his brother and father as above  He was admitted and was anticoagulated.  His pain went away The patient works in an office job.  He is sedentary He was anticoagulated with apixaban without problems  I have reviewed the past medical history, past surgical history, social history and family history with the patient and they are unchanged from previous note.  ALLERGIES:  is allergic to sulfa antibiotics.  MEDICATIONS:  Current Outpatient Medications  Medication Sig Dispense Refill  . acetaminophen (TYLENOL) 500 MG tablet Take 1,000 mg by mouth every 8 (eight) hours as needed for mild pain.    Marland Kitchen apixaban (ELIQUIS) 5 MG TABS tablet Take 1 tablet (5 mg total) by mouth 2 (two) times daily. 60 tablet 0  . aspirin 81 MG chewable tablet Chew 81 mg by mouth daily.     Marland Kitchen atorvastatin (LIPITOR) 40 MG tablet Take 40 mg by mouth daily.     Marland Kitchen lisinopril (ZESTRIL) 5 MG tablet Take 5 mg by mouth daily.     . metoprolol succinate (TOPROL-XL) 100 MG 24 hr tablet Take 100 mg by mouth daily.     . nitroGLYCERIN (NITROSTAT) 0.4 MG SL tablet Place under the tongue.    . pantoprazole (PROTONIX) 40 MG tablet Take 1 tablet (40 mg total) by mouth daily. 30 tablet 0   No current facility-administered medications for this visit.     REVIEW OF SYSTEMS:   Constitutional: Denies fevers, chills or night sweats Eyes: Denies blurriness of vision Ears, nose, mouth, throat, and face: Denies mucositis or sore throat Respiratory: Denies cough, dyspnea or wheezes Cardiovascular: Denies palpitation, chest discomfort or lower extremity swelling Gastrointestinal:  Denies nausea, heartburn or change in bowel habits Skin: Denies abnormal skin rashes Lymphatics: Denies new lymphadenopathy or easy bruising Neurological:Denies numbness, tingling or new weaknesses Behavioral/Psych: Mood is stable, no new changes  All other  systems were reviewed with the patient and are negative.  PHYSICAL EXAMINATION: ECOG PERFORMANCE STATUS: 0 - Asymptomatic  Vitals:   08/25/20 1006  BP: 132/73  Pulse: (!) 56  Resp: 18  Temp: (!) 97.4 F (36.3 C)  SpO2: 97%   Filed Weights   08/25/20 1006  Weight: 239 lb 3.2 oz (108.5 kg)    GENERAL:alert, no distress and comfortable NEURO: alert & oriented x 3 with fluent speech, no focal motor/sensory deficits  LABORATORY DATA:  I have reviewed the data as listed     Component Value Date/Time   NA 134 (L) 02/15/2020 0321   K 3.8 02/15/2020 0321   CL 100 02/15/2020 0321   CO2 24 02/15/2020 0321   GLUCOSE 116 (H) 02/15/2020 0321   BUN 8 02/15/2020 0321   CREATININE 1.05 02/15/2020 0321   CALCIUM 8.5 (L) 02/15/2020 0321   PROT 7.2 02/13/2020 0528   ALBUMIN 4.0 02/13/2020 0528   AST 38 02/13/2020 0528   ALT 47 (H) 02/13/2020 0528   ALKPHOS 86 02/13/2020 0528   BILITOT 0.7 02/13/2020 0528   GFRNONAA >60 02/15/2020 0321   GFRAA >60 02/15/2020 0321    No results found for: SPEP, UPEP  Lab Results  Component Value Date   WBC 18.5 (H) 02/15/2020  NEUTROABS 12.4 (H) 02/13/2020   HGB 13.7 02/15/2020   HCT 41.8 02/15/2020   MCV 89.1 02/15/2020   PLT 156 02/15/2020      Chemistry      Component Value Date/Time   NA 134 (L) 02/15/2020 0321   K 3.8 02/15/2020 0321   CL 100 02/15/2020 0321   CO2 24 02/15/2020 0321   BUN 8 02/15/2020 0321   CREATININE 1.05 02/15/2020 0321      Component Value Date/Time   CALCIUM 8.5 (L) 02/15/2020 0321   ALKPHOS 86 02/13/2020 0528   AST 38 02/13/2020 0528   ALT 47 (H) 02/13/2020 0528   BILITOT 0.7 02/13/2020 0528

## 2020-08-25 NOTE — Assessment & Plan Note (Signed)
The most likely cause of the splenic infarct is arterial thrombotic event originating from his heart Echocardiogram revealed area of hypokinesis Even though he was taking aspirin therapy after his heart attack, his sedentary lifestyle and class II obesity likely contributed to recent arterial event He is doing well without further pain or bleeding complications Unfortunately, he remains sedentary and has not lost any weight I recommend continuing apixaban indefinitely as secondary prevention We discussed the rationale of not repeating imaging study as it would not change management We discussed 2.5 mg dose versus 5 mg dose Due to his class II obesity, I recommend he continues at 5 mg dose However, if the patient start to experience renal dysfunction or bleeding complications, then I would recommend reducing the dose to 2.5 mg twice a day We discussed interruption of anticoagulation therapy in the setting of surgery; in those situations, he is instructed to call me I refill his prescription for 1 more month If his primary care doctor is willing to refill it, he does not need to return However, if his primary doctor felt he should see me for long-term visit, I will see him back in a year I have addressed all his questions and concerns

## 2021-06-08 IMAGING — CT CT ABD-PELV W/ CM
2 of 5 series · 16 of 46 positions shown, 18 images · IV contrast (omnipaque)
Comparison: None.

CLINICAL DATA: Upper abdominal pain

EXAM:
CT ABDOMEN AND PELVIS WITH CONTRAST
TECHNIQUE: Multidetector CT imaging of the abdomen and pelvis was performed
using the standard protocol following bolus administration of
intravenous contrast.
CONTRAST:  100mL OMNIPAQUE IOHEXOL 300 MG/ML  SOLN

[Series 2: axial st · axial · 0.89mm/px · z∈[-447,-27]mm · 13 of 96 slices shown, 15 images]
[im 6/96  soft-tissue]
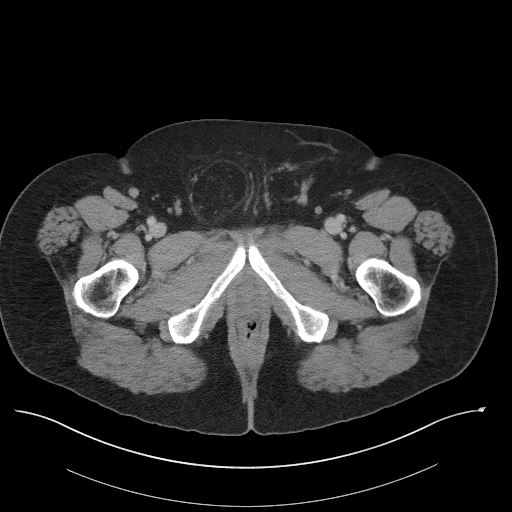
[im 6/96  bone]
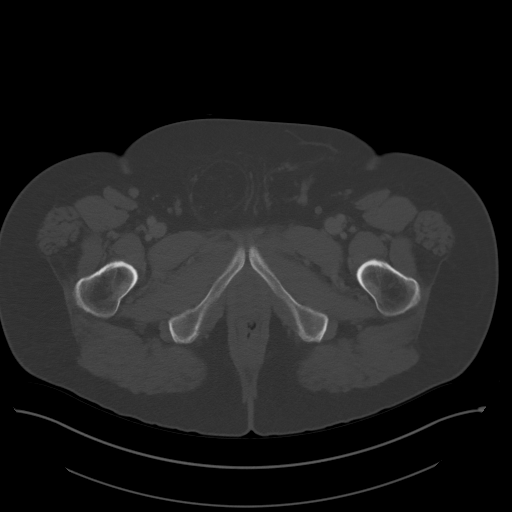
[im 11/96  soft-tissue]
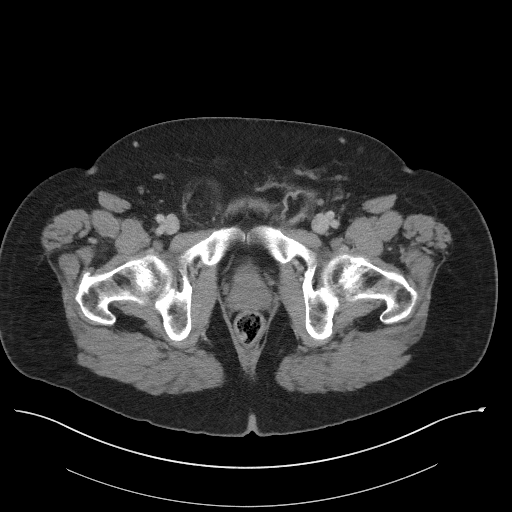
[im 22/96  soft-tissue]
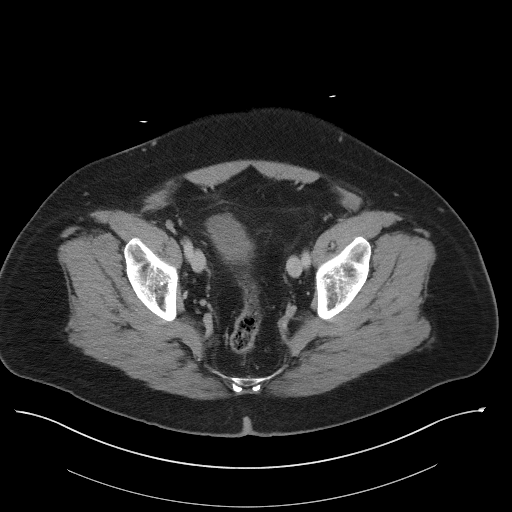
[im 27/96  soft-tissue]
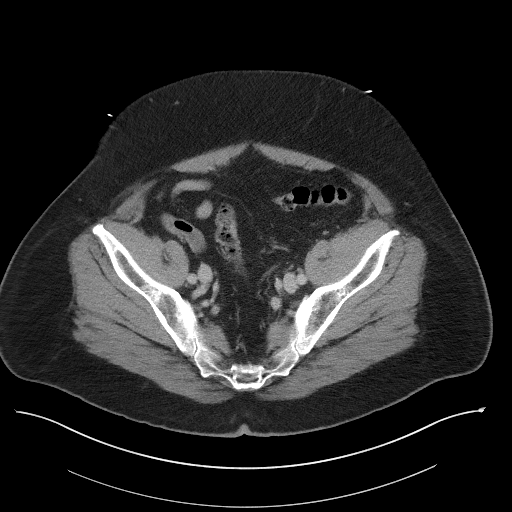
[im 32/96  soft-tissue]
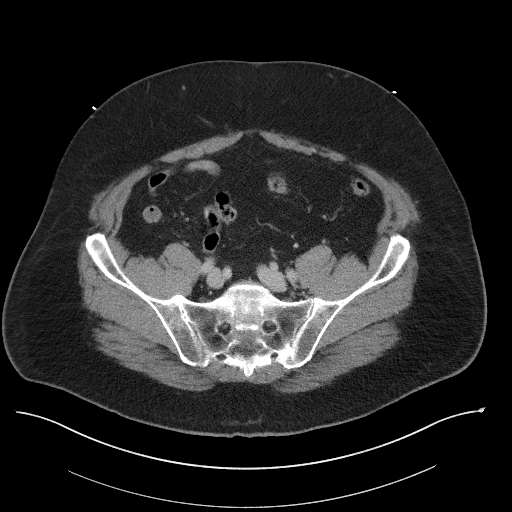
[im 43/96  soft-tissue]
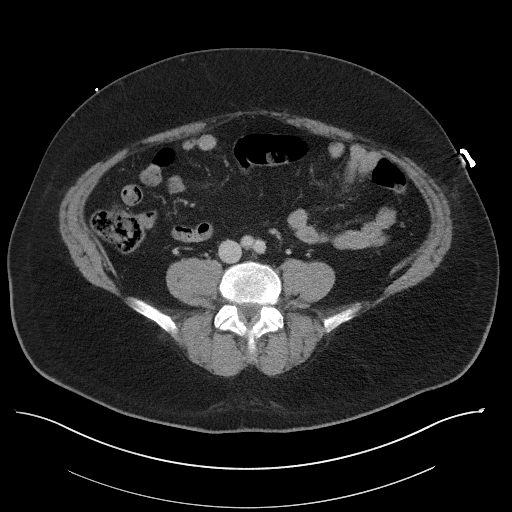
[im 48/96  soft-tissue]
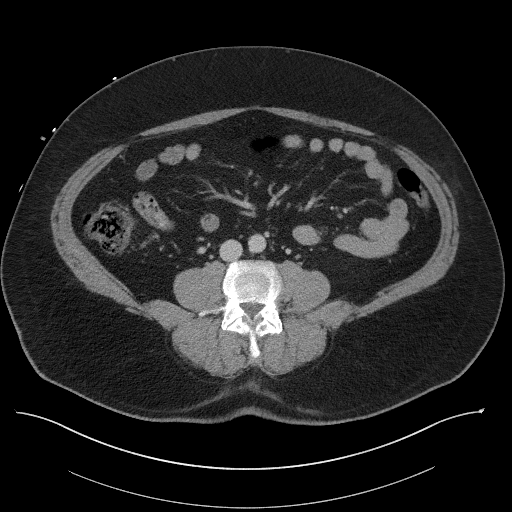
[im 53/96  soft-tissue]
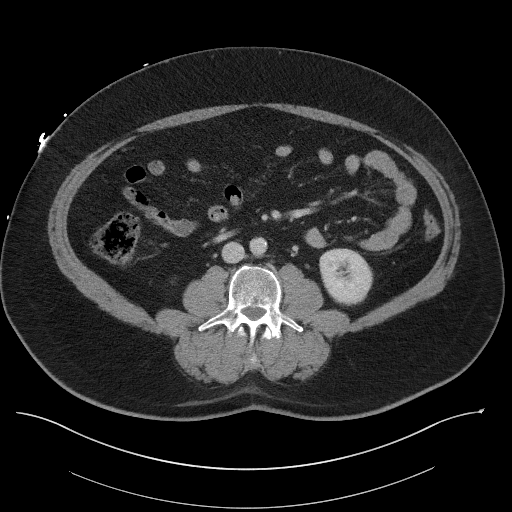
[im 64/96  soft-tissue]
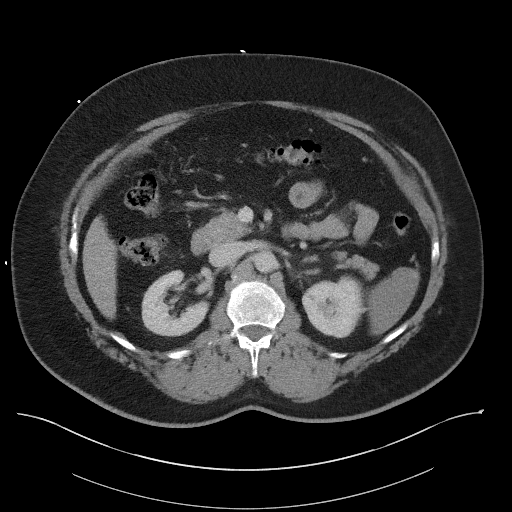
[im 64/96  bone]
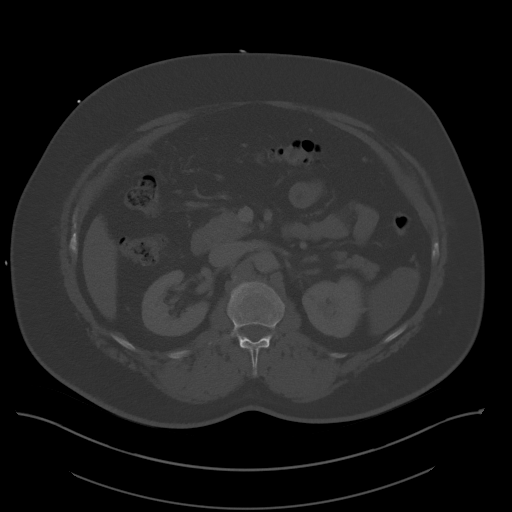
[im 69/96  soft-tissue]
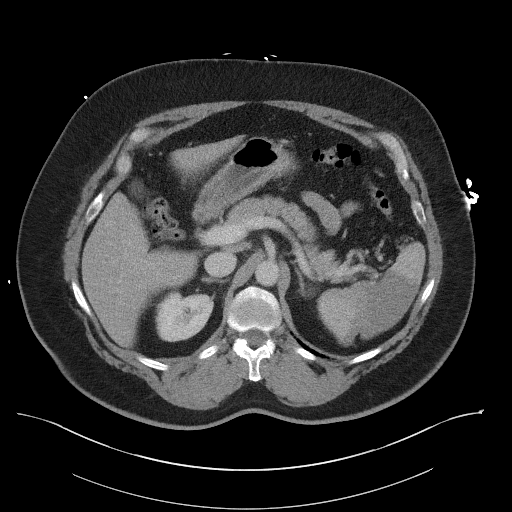
[im 74/96  soft-tissue]
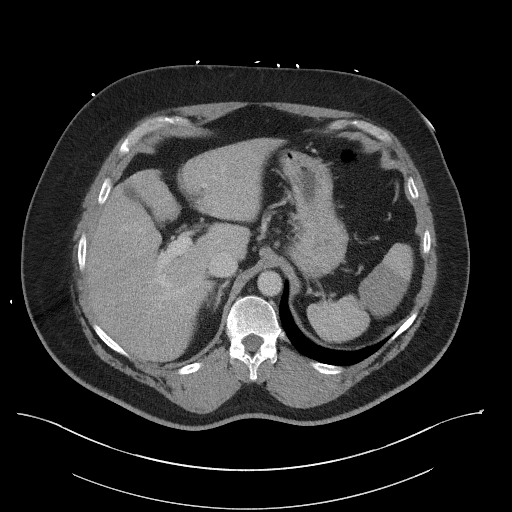
[im 85/96  soft-tissue]
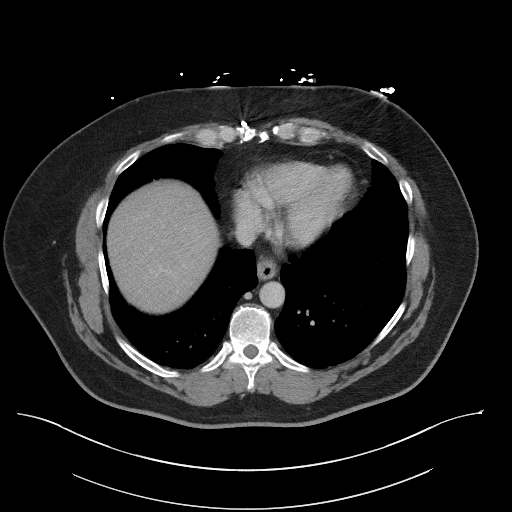
[im 90/96  soft-tissue]
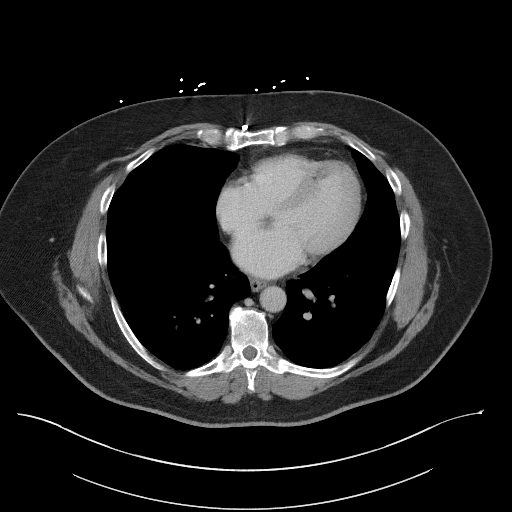

[Series 5: coronal st · coronal · 0.80mm/px · 3 of 96 slices shown]
[im 32/96  soft-tissue]
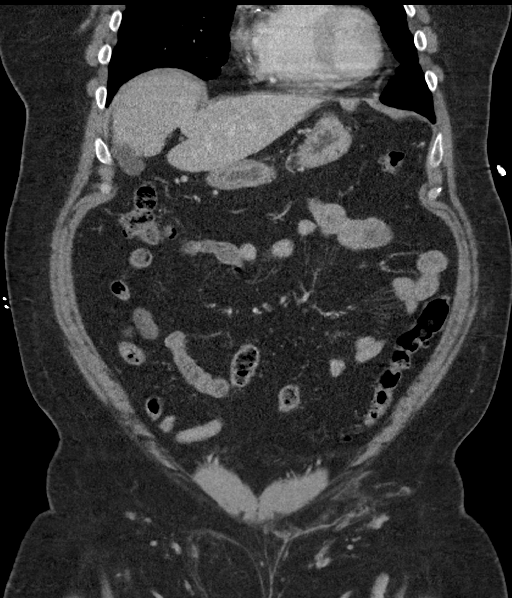
[im 43/96  soft-tissue]
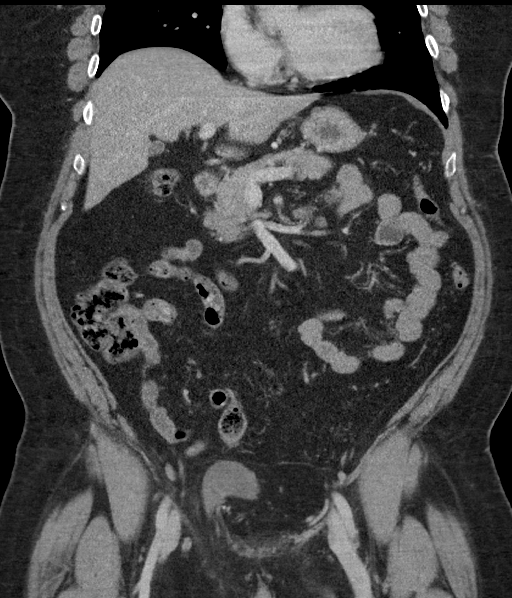
[im 53/96  soft-tissue]
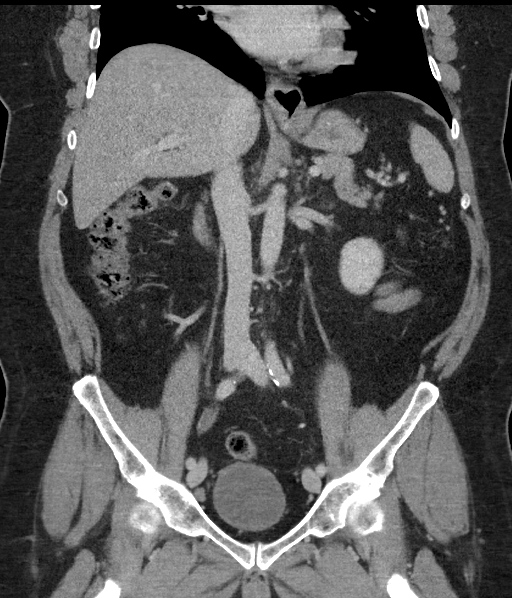

[16 of 46 positions shown; findings below may reference images not displayed]

FINDINGS: Lower chest: Fatty band of cross the apical left ventricle,
subendocardial. There is history of cardiac surgery and a prior
median sternotomy, presumably related.

Hepatobiliary: No focal liver abnormality.No evidence of biliary
obstruction or stone.

Pancreas: Unremarkable.

Spleen: Wedge-shaped area of non enhancement involving the central
spleen, at least 50% of the volume. No hemorrhage is noted. There is
an associated poorly enhancing splenic artery branch at the hilum.
No underlying dissection noted.

Adrenals/Urinary Tract: Negative adrenals. No hydronephrosis or
stone. 13 mm left renal cyst. The dome of the bladder is deviated
into a right inguinal hernia.

Stomach/Bowel:  No obstruction. No appendicitis.

Vascular/Lymphatic: No acute vascular abnormality. Scattered
atheromatous changes. No mass or adenopathy.

Reproductive:No pathologic findings.

Other: No ascites or pneumoperitoneum. Fatty enlargement of the
right inguinal canal. There has been a left inguinal hernia repair
using mesh.

Musculoskeletal: No acute abnormalities.
IMPRESSION: 1. Large splenic infarct associated with a probable splenic artery
branch occlusion.
2. Large fatty right inguinal hernia with minimal involvement of the
bladder dome.
3.  Aortic Atherosclerosis ([id number]).

## 2021-06-08 IMAGING — DX DG CHEST 1V PORT
1 series · 1 of 1 positions shown · non-contrast
Comparison: Radiograph 06/17/2016

CLINICAL DATA: Upper abdominal pain

EXAM:
PORTABLE CHEST 1 VIEW

[chest ap]
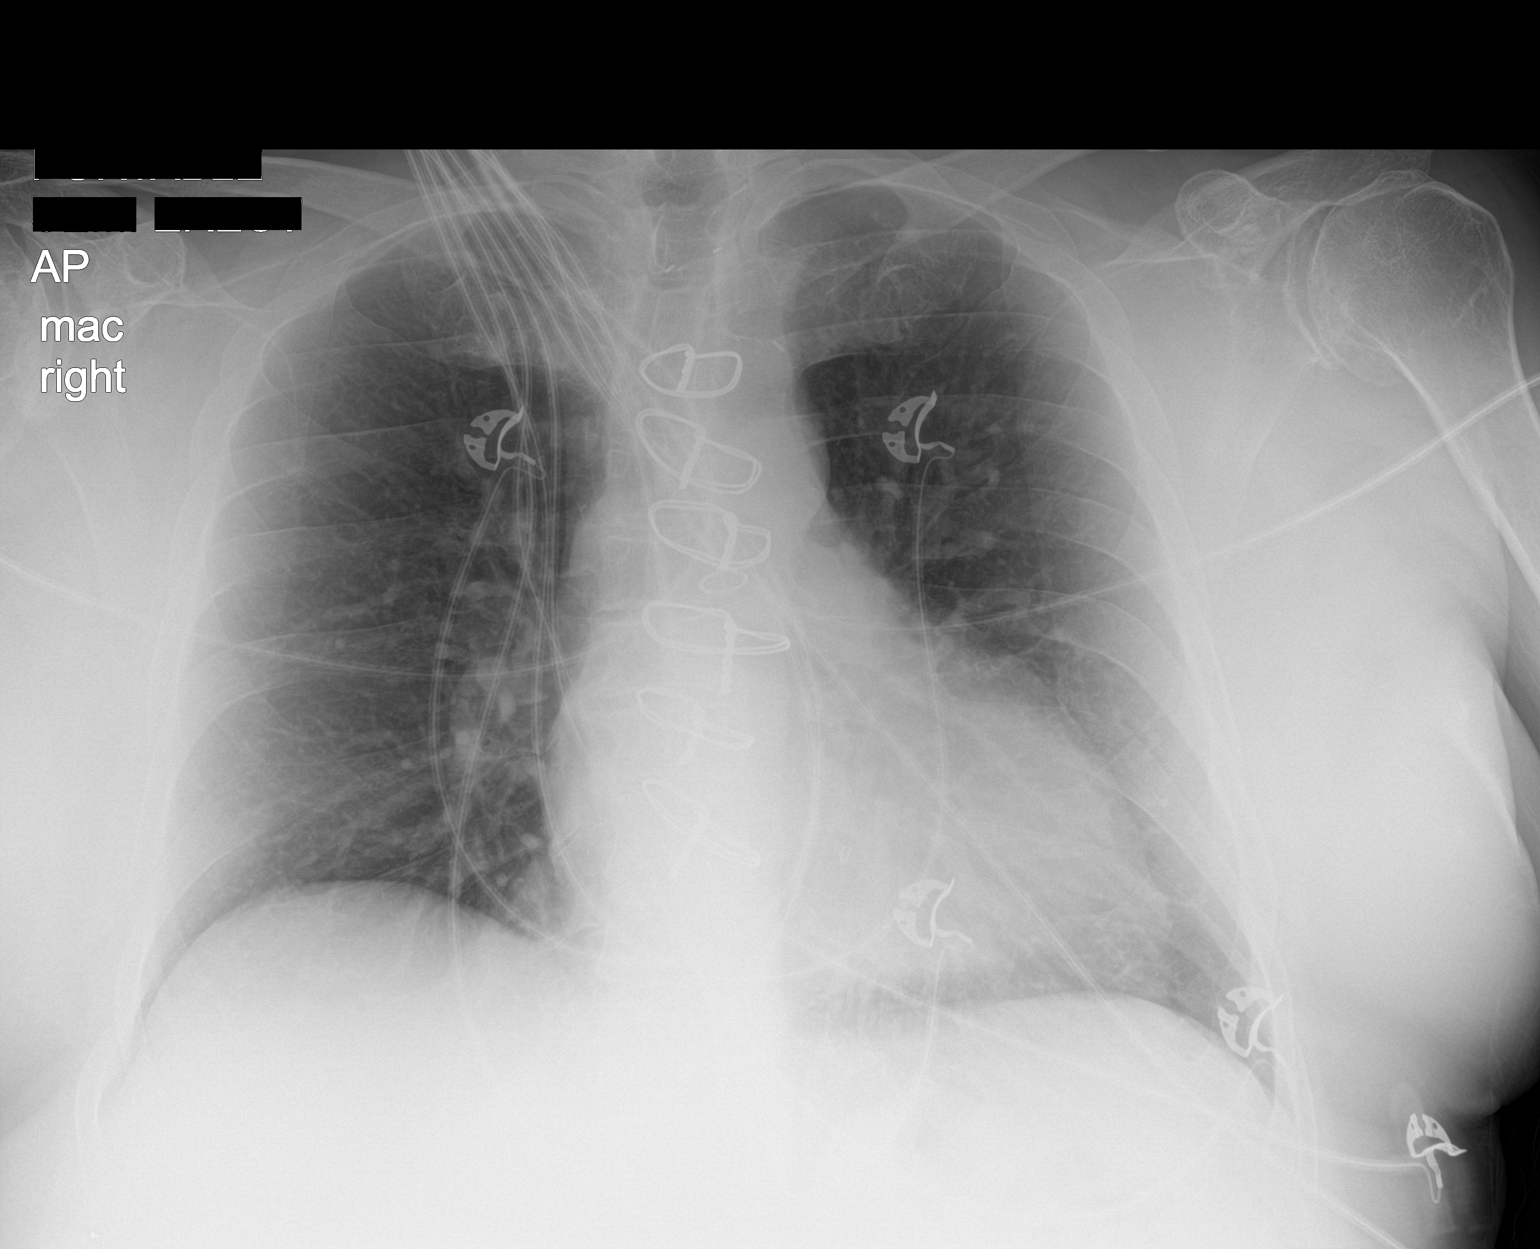

[1 of 1 positions shown; findings below may reference images not displayed]

FINDINGS: Postsurgical changes related to prior CABG including intact and
aligned sternotomy wires and multiple surgical clips projecting over
the mediastinum. Could mediastinal contours are otherwise within
normal limits for the portable technique. Some streaky opacities in
the bases likely reflect atelectasis. No focal consolidation,
features of edema, pneumothorax, or effusion. No acute osseous or
soft tissue abnormality. Degenerative changes are present in the
imaged spine and shoulders. Telemetry leads overlie the chest.
IMPRESSION: Streaky opacities in the bases likely reflect atelectasis. No other
acute cardiopulmonary abnormality.
